# Patient Record
Sex: Male | Born: 1980 | Race: Black or African American | Hispanic: No | Marital: Single | Smoking: Former smoker
Health system: Southern US, Community
[De-identification: ages and names within clinical notes are randomized; demographics above are authoritative.]

## PROBLEM LIST (undated history)

## (undated) DIAGNOSIS — L988 Other specified disorders of the skin and subcutaneous tissue: Secondary | ICD-10-CM

## (undated) DIAGNOSIS — Z789 Other specified health status: Secondary | ICD-10-CM

## (undated) DIAGNOSIS — F1910 Other psychoactive substance abuse, uncomplicated: Secondary | ICD-10-CM

## (undated) DIAGNOSIS — F1111 Opioid abuse, in remission: Secondary | ICD-10-CM

## (undated) DIAGNOSIS — F410 Panic disorder [episodic paroxysmal anxiety] without agoraphobia: Secondary | ICD-10-CM

## (undated) DIAGNOSIS — F419 Anxiety disorder, unspecified: Secondary | ICD-10-CM

## (undated) DIAGNOSIS — Z87898 Personal history of other specified conditions: Secondary | ICD-10-CM

## (undated) DIAGNOSIS — Z973 Presence of spectacles and contact lenses: Secondary | ICD-10-CM

## (undated) DIAGNOSIS — F329 Major depressive disorder, single episode, unspecified: Secondary | ICD-10-CM

## (undated) DIAGNOSIS — R002 Palpitations: Secondary | ICD-10-CM

## (undated) DIAGNOSIS — Z8679 Personal history of other diseases of the circulatory system: Secondary | ICD-10-CM

## (undated) DIAGNOSIS — Z8659 Personal history of other mental and behavioral disorders: Secondary | ICD-10-CM

## (undated) DIAGNOSIS — N401 Enlarged prostate with lower urinary tract symptoms: Secondary | ICD-10-CM

## (undated) HISTORY — DX: Anxiety disorder, unspecified: F41.9

## (undated) HISTORY — DX: Other specified health status: Z78.9

## (undated) HISTORY — DX: Major depressive disorder, single episode, unspecified: F32.9

---
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
## 2011-05-07 HISTORY — PX: OTHER SURGICAL HISTORY: SHX169

## 2012-03-08 ENCOUNTER — Emergency Department (HOSPITAL_COMMUNITY): Payer: Self-pay

## 2012-03-08 ENCOUNTER — Emergency Department (HOSPITAL_COMMUNITY)
Admission: EM | Admit: 2012-03-08 | Discharge: 2012-03-08 | Disposition: A | Discharge status: Home / Self Care | Payer: Self-pay | Attending: Emergency Medicine | Admitting: Emergency Medicine

## 2012-03-08 ENCOUNTER — Encounter (HOSPITAL_COMMUNITY): Payer: Self-pay | Admitting: Anesthesiology

## 2012-03-08 ENCOUNTER — Encounter (HOSPITAL_COMMUNITY)
Admission: EM | Disposition: A | Discharge status: Home / Self Care | Payer: Self-pay | Source: Home / Self Care | Attending: Emergency Medicine

## 2012-03-08 ENCOUNTER — Emergency Department (HOSPITAL_COMMUNITY): Payer: Self-pay | Admitting: Anesthesiology

## 2012-03-08 DIAGNOSIS — W268XXA Contact with other sharp object(s), not elsewhere classified, initial encounter: Secondary | ICD-10-CM | POA: Insufficient documentation

## 2012-03-08 DIAGNOSIS — Y9389 Activity, other specified: Secondary | ICD-10-CM | POA: Insufficient documentation

## 2012-03-08 DIAGNOSIS — S81802A Unspecified open wound, left lower leg, initial encounter: Secondary | ICD-10-CM

## 2012-03-08 DIAGNOSIS — S81009A Unspecified open wound, unspecified knee, initial encounter: Secondary | ICD-10-CM | POA: Insufficient documentation

## 2012-03-08 HISTORY — PX: I & D EXTREMITY: SHX5045

## 2012-03-08 LAB — BASIC METABOLIC PANEL
CO2: 29 mEq/L (ref 19–32)
Calcium: 9.2 mg/dL (ref 8.4–10.5)
Chloride: 98 mEq/L (ref 96–112)
Glucose, Bld: 101 mg/dL — ABNORMAL HIGH (ref 70–99)
Sodium: 138 mEq/L (ref 135–145)

## 2012-03-08 LAB — CBC
HCT: 42.8 % (ref 39.0–52.0)
MCH: 31.9 pg (ref 26.0–34.0)
MCV: 94.7 fL (ref 78.0–100.0)
Platelets: 247 10*3/uL (ref 150–400)
RBC: 4.52 MIL/uL (ref 4.22–5.81)

## 2012-03-08 SURGERY — IRRIGATION AND DEBRIDEMENT EXTREMITY
Anesthesia: General | Site: Leg Lower | Laterality: Left | Wound class: Dirty or Infected

## 2012-03-08 MED ORDER — OXYCODONE HCL 5 MG PO TABS
ORAL_TABLET | ORAL | Status: AC
  Filled 2012-03-08: qty 1

## 2012-03-08 MED ORDER — HYDROMORPHONE HCL PF 1 MG/ML IJ SOLN
0.5000 mg | INTRAMUSCULAR | Status: AC | PRN
  Administered 2012-03-08 (×2): 0.5 mg via INTRAVENOUS

## 2012-03-08 MED ORDER — CEFAZOLIN SODIUM 1-5 GM-% IV SOLN
INTRAVENOUS | Status: AC
  Filled 2012-03-08: qty 50

## 2012-03-08 MED ORDER — CEFAZOLIN SODIUM 1-5 GM-% IV SOLN
1.0000 g | Freq: Once | INTRAVENOUS | Status: AC
  Administered 2012-03-08: 1 g via INTRAVENOUS

## 2012-03-08 MED ORDER — MIDAZOLAM HCL 5 MG/5ML IJ SOLN
INTRAMUSCULAR | Status: DC | PRN
  Administered 2012-03-08: 2 mg via INTRAVENOUS

## 2012-03-08 MED ORDER — OXYCODONE HCL 5 MG/5ML PO SOLN: 5.0000 mg | Freq: Once | ORAL | Status: AC | PRN

## 2012-03-08 MED ORDER — ONDANSETRON HCL 4 MG/2ML IJ SOLN
INTRAMUSCULAR | Status: AC
  Filled 2012-03-08: qty 2

## 2012-03-08 MED ORDER — HYDROMORPHONE HCL PF 1 MG/ML IJ SOLN
1.0000 mg | Freq: Once | INTRAMUSCULAR | Status: AC
  Administered 2012-03-08: 1 mg via INTRAVENOUS
  Filled 2012-03-08: qty 1

## 2012-03-08 MED ORDER — HYDROMORPHONE HCL PF 1 MG/ML IJ SOLN
INTRAMUSCULAR | Status: AC
  Filled 2012-03-08: qty 1

## 2012-03-08 MED ORDER — TETANUS-DIPHTH-ACELL PERTUSSIS 5-2.5-18.5 LF-MCG/0.5 IM SUSP
INTRAMUSCULAR | Status: AC
  Filled 2012-03-08: qty 0.5

## 2012-03-08 MED ORDER — SODIUM CHLORIDE 0.9 % IR SOLN
Status: DC | PRN
  Administered 2012-03-08: 07:00:00

## 2012-03-08 MED ORDER — MEPERIDINE HCL 25 MG/ML IJ SOLN: 6.2500 mg | INTRAMUSCULAR | Status: DC | PRN

## 2012-03-08 MED ORDER — HYDROCODONE-ACETAMINOPHEN 5-500 MG PO TABS: 1.0000 | ORAL_TABLET | Freq: Four times a day (QID) | ORAL | Status: DC | PRN

## 2012-03-08 MED ORDER — HYDROMORPHONE BOLUS VIA INFUSION: 1.0000 mg | Freq: Once | INTRAVENOUS | Status: DC

## 2012-03-08 MED ORDER — OXYCODONE HCL 5 MG PO TABS
5.0000 mg | ORAL_TABLET | Freq: Once | ORAL | Status: AC | PRN
  Administered 2012-03-08: 5 mg via ORAL

## 2012-03-08 MED ORDER — LACTATED RINGERS IV SOLN
INTRAVENOUS | Status: DC | PRN
  Administered 2012-03-08 (×2): via INTRAVENOUS

## 2012-03-08 MED ORDER — ONDANSETRON HCL 4 MG/2ML IJ SOLN
INTRAMUSCULAR | Status: DC | PRN
  Administered 2012-03-08: 4 mg via INTRAVENOUS

## 2012-03-08 MED ORDER — ONDANSETRON HCL 4 MG/2ML IJ SOLN: 4.0000 mg | Freq: Once | INTRAMUSCULAR | Status: DC | PRN

## 2012-03-08 MED ORDER — HYDROMORPHONE HCL PF 1 MG/ML IJ SOLN
0.2500 mg | INTRAMUSCULAR | Status: DC | PRN
  Administered 2012-03-08 (×4): 0.5 mg via INTRAVENOUS

## 2012-03-08 MED ORDER — SODIUM CHLORIDE 0.9 % IR SOLN
Status: DC | PRN
  Administered 2012-03-08: 3000 mL

## 2012-03-08 MED ORDER — SUCCINYLCHOLINE CHLORIDE 20 MG/ML IJ SOLN
INTRAMUSCULAR | Status: DC | PRN
  Administered 2012-03-08: 100 mg via INTRAVENOUS

## 2012-03-08 MED ORDER — PROPOFOL 10 MG/ML IV BOLUS
INTRAVENOUS | Status: DC | PRN
  Administered 2012-03-08: 110 mg via INTRAVENOUS

## 2012-03-08 MED ORDER — LIDOCAINE HCL (CARDIAC) 20 MG/ML IV SOLN
INTRAVENOUS | Status: DC | PRN
  Administered 2012-03-08: 100 mg via INTRAVENOUS

## 2012-03-08 MED ORDER — HYDROMORPHONE HCL PF 1 MG/ML IJ SOLN
INTRAMUSCULAR | Status: AC
  Administered 2012-03-08: 1 mg via INTRAVENOUS
  Filled 2012-03-08: qty 1

## 2012-03-08 MED ORDER — FENTANYL CITRATE 0.05 MG/ML IJ SOLN
INTRAMUSCULAR | Status: DC | PRN
  Administered 2012-03-08 (×2): 100 ug via INTRAVENOUS

## 2012-03-08 SURGICAL SUPPLY — 52 items
BANDAGE ELASTIC 4 VELCRO ST LF (GAUZE/BANDAGES/DRESSINGS) ×2 IMPLANT
BANDAGE ELASTIC 6 VELCRO ST LF (GAUZE/BANDAGES/DRESSINGS) ×2 IMPLANT
BANDAGE GAUZE ELAST BULKY 4 IN (GAUZE/BANDAGES/DRESSINGS) ×2 IMPLANT
BLADE SURG 10 STRL SS (BLADE) IMPLANT
BNDG COHESIVE 4X5 TAN STRL (GAUZE/BANDAGES/DRESSINGS) IMPLANT
CLOTH BEACON ORANGE TIMEOUT ST (SAFETY) ×2 IMPLANT
COVER SURGICAL LIGHT HANDLE (MISCELLANEOUS) ×2 IMPLANT
CUFF TOURNIQUET SINGLE 34IN LL (TOURNIQUET CUFF) IMPLANT
CUFF TOURNIQUET SINGLE 44IN (TOURNIQUET CUFF) IMPLANT
DRAIN PENROSE 1/4X12 LTX STRL (WOUND CARE) ×2 IMPLANT
DRSG ADAPTIC 3X8 NADH LF (GAUZE/BANDAGES/DRESSINGS) ×2 IMPLANT
DRSG PAD ABDOMINAL 8X10 ST (GAUZE/BANDAGES/DRESSINGS) ×2 IMPLANT
DURAPREP 26ML APPLICATOR (WOUND CARE) IMPLANT
ELECT REM PT RETURN 9FT ADLT (ELECTROSURGICAL) ×2
ELECTRODE REM PT RTRN 9FT ADLT (ELECTROSURGICAL) ×1 IMPLANT
EVACUATOR 1/8 PVC DRAIN (DRAIN) IMPLANT
FACESHIELD LNG OPTICON STERILE (SAFETY) ×2 IMPLANT
GAUZE XEROFORM 1X8 LF (GAUZE/BANDAGES/DRESSINGS) ×2 IMPLANT
GLOVE BIO SURGEON STRL SZ8.5 (GLOVE) ×2 IMPLANT
GLOVE BIOGEL PI IND STRL 8.5 (GLOVE) ×1 IMPLANT
GLOVE BIOGEL PI INDICATOR 8.5 (GLOVE) ×1
GLOVE ECLIPSE 7.0 STRL STRAW (GLOVE) ×2 IMPLANT
GLOVE SS BIOGEL STRL SZ 8 (GLOVE) ×1 IMPLANT
GLOVE SUPERSENSE BIOGEL SZ 8 (GLOVE) ×1
GOWN EXTRA PROTECTION XL (GOWNS) ×2 IMPLANT
GOWN PREVENTION PLUS XXLARGE (GOWN DISPOSABLE) ×2 IMPLANT
GOWN STRL NON-REIN LRG LVL3 (GOWN DISPOSABLE) IMPLANT
HANDPIECE INTERPULSE COAX TIP (DISPOSABLE)
IV NS IRRIG 3000ML ARTHROMATIC (IV SOLUTION) ×2 IMPLANT
KIT BASIN OR (CUSTOM PROCEDURE TRAY) ×2 IMPLANT
KIT ROOM TURNOVER OR (KITS) ×2 IMPLANT
MANIFOLD NEPTUNE II (INSTRUMENTS) ×2 IMPLANT
NS IRRIG 1000ML POUR BTL (IV SOLUTION) ×2 IMPLANT
PACK ORTHO EXTREMITY (CUSTOM PROCEDURE TRAY) ×2 IMPLANT
PAD ARMBOARD 7.5X6 YLW CONV (MISCELLANEOUS) ×4 IMPLANT
PENCIL BUTTON HOLSTER BLD 10FT (ELECTRODE) IMPLANT
SET HNDPC FAN SPRY TIP SCT (DISPOSABLE) IMPLANT
SPONGE GAUZE 4X4 12PLY (GAUZE/BANDAGES/DRESSINGS) ×2 IMPLANT
SPONGE LAP 18X18 X RAY DECT (DISPOSABLE) ×2 IMPLANT
STOCKINETTE IMPERVIOUS 9X36 MD (GAUZE/BANDAGES/DRESSINGS) IMPLANT
SUT ETHILON 3 0 PS 1 (SUTURE) IMPLANT
SUT PROLENE 2 0 CT 1 (SUTURE) ×4 IMPLANT
SUT VIC AB 2-0 CT1 27 (SUTURE) ×3
SUT VIC AB 2-0 CT1 TAPERPNT 27 (SUTURE) ×3 IMPLANT
TOWEL OR 17X24 6PK STRL BLUE (TOWEL DISPOSABLE) ×2 IMPLANT
TOWEL OR 17X26 10 PK STRL BLUE (TOWEL DISPOSABLE) ×2 IMPLANT
TUBE ANAEROBIC SPECIMEN COL (MISCELLANEOUS) IMPLANT
TUBE CONNECTING 12X1/4 (SUCTIONS) ×2 IMPLANT
TUBING CYSTO DISP (UROLOGICAL SUPPLIES) ×2 IMPLANT
UNDERPAD 30X30 INCONTINENT (UNDERPADS AND DIAPERS) ×2 IMPLANT
WATER STERILE IRR 1000ML POUR (IV SOLUTION) IMPLANT
YANKAUER SUCT BULB TIP NO VENT (SUCTIONS) ×2 IMPLANT
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
## 2012-03-08 NOTE — Anesthesia Procedure Notes (Signed)
Procedure Name: Intubation Date/Time: 03/08/2012 6:08 AM Performed by: Garen Lah Pre-anesthesia Checklist: Patient identified, Timeout performed, Emergency Drugs available, Suction available and Patient being monitored Patient Re-evaluated:Patient Re-evaluated prior to inductionOxygen Delivery Method: Circle system utilized Preoxygenation: Pre-oxygenation with 100% oxygen Intubation Type: IV induction, Cricoid Pressure applied and Rapid sequence Laryngoscope Size: Mac and 3 Grade View: Grade I Tube type: Oral Tube size: 7.5 mm Number of attempts: 1 Airway Equipment and Method: Stylet Placement Confirmation: ETT inserted through vocal cords under direct vision,  positive ETCO2 and breath sounds checked- equal and bilateral Secured at: 21 cm Tube secured with: Tape Dental Injury: Teeth and Oropharynx as per pre-operative assessment

## 2012-03-08 NOTE — Progress Notes (Signed)
Orthopedic Tech Progress Note Patient Details:  Jesse Long [birth year] [id number]  Ortho Devices Type of Ortho Device: Crutches Ortho Device/Splint Interventions: Application   Cammer, Mickie Bail 03/08/2012, 8:40 AM

## 2012-03-08 NOTE — ED Notes (Signed)
Pt resting quietly at the time. Friends at bedside. States some relief of pain, rating 6/10 at the time. No signs of distress noted.

## 2012-03-08 NOTE — Op Note (Signed)
#  [id number] 
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
## 2012-03-08 NOTE — ED Notes (Signed)
Orthopedic physician at the bedside. Pt to be taken to OR for surgery.

## 2012-03-08 NOTE — Transfer of Care (Signed)
Immediate Anesthesia Transfer of Care Note  Patient: Jesse Long  Procedure(s) Performed: Procedure(s) (LRB) with comments: IRRIGATION AND DEBRIDEMENT EXTREMITY (Left)  Patient Location: PACU  Anesthesia Type:General  Level of Consciousness: sedated  Airway & Oxygen Therapy: Patient Spontanous Breathing  Post-op Assessment: Report given to PACU RN and Post -op Vital signs reviewed and stable  Post vital signs: Reviewed and stable  Complications: No apparent anesthesia complications

## 2012-03-08 NOTE — Preoperative (Signed)
Beta Blockers   Reason not to administer Beta Blockers:Not Applicable. No home beta blockers 

## 2012-03-08 NOTE — Interval H&P Note (Signed)
History and Physical Interval Note:  03/08/2012 5:58 AM  Jesse Long  has presented today for surgery, with the diagnosis of Open wound left leg  The various methods of treatment have been discussed with the patient and family. After consideration of risks, benefits and other options for treatment, the patient has consented to  Procedure(s) (LRB) with comments: IRRIGATION AND DEBRIDEMENT EXTREMITY (Left) as a surgical intervention .  The patient's history has been reviewed, patient examined, no change in status, stable for surgery.  I have reviewed the patient's chart and labs.  Questions were answered to the patient's satisfaction.     Kerolos Nehme G
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
## 2012-03-08 NOTE — Anesthesia Preprocedure Evaluation (Addendum)
Anesthesia Evaluation  Patient identified by MRN, date of birth, ID band Patient awake    Reviewed: Allergy & Precautions, H&P , NPO status , Patient's Chart, lab work & pertinent test results, reviewed documented beta blocker date and time   History of Anesthesia Complications Negative for: history of anesthetic complications  Airway Mallampati: I TM Distance: >3 FB Neck ROM: Full    Dental  (+) Teeth Intact and Dental Advisory Given   Pulmonary          Cardiovascular     Neuro/Psych    GI/Hepatic   Endo/Other    Renal/GU      Musculoskeletal   Abdominal   Peds  Hematology   Anesthesia Other Findings   Reproductive/Obstetrics                          Anesthesia Physical Anesthesia Plan  ASA: I and emergent  Anesthesia Plan: General   Post-op Pain Management:    Induction: Intravenous, Rapid sequence and Cricoid pressure planned  Airway Management Planned: Oral ETT  Additional Equipment:   Intra-op Plan:   Post-operative Plan: Extubation in OR  Informed Consent: I have reviewed the patients History and Physical, chart, labs and discussed the procedure including the risks, benefits and alternatives for the proposed anesthesia with the patient or authorized representative who has indicated his/her understanding and acceptance.   Dental advisory given  Plan Discussed with: CRNA and Surgeon  Anesthesia Plan Comments:        Anesthesia Quick Evaluation

## 2012-03-08 NOTE — ED Notes (Signed)
Pt transported to OR for surgery, consent signed. Pt had no further questions.

## 2012-03-08 NOTE — Anesthesia Postprocedure Evaluation (Signed)
  Anesthesia Post-op Note  Patient: Jesse Long  Procedure(s) Performed: Procedure(s) (LRB) with comments: IRRIGATION AND DEBRIDEMENT EXTREMITY (Left)  Patient Location: PACU  Anesthesia Type:General  Level of Consciousness: awake and alert   Airway and Oxygen Therapy: Patient Spontanous Breathing  Post-op Pain: moderate  Post-op Assessment: Post-op Vital signs reviewed, Patient's Cardiovascular Status Stable, Respiratory Function Stable, Patent Airway and No signs of Nausea or vomiting  Post-op Vital Signs: Reviewed and stable  Complications: No apparent anesthesia complications
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
## 2012-03-09 ENCOUNTER — Encounter (HOSPITAL_COMMUNITY): Payer: Self-pay | Admitting: Orthopaedic Surgery

## 2013-05-06 DIAGNOSIS — F1910 Other psychoactive substance abuse, uncomplicated: Secondary | ICD-10-CM

## 2013-05-06 HISTORY — DX: Other psychoactive substance abuse, uncomplicated: F19.10

## 2013-07-24 ENCOUNTER — Encounter (HOSPITAL_COMMUNITY): Payer: Self-pay | Admitting: Emergency Medicine

## 2013-07-24 ENCOUNTER — Emergency Department (HOSPITAL_COMMUNITY)
Admission: EM | Admit: 2013-07-24 | Discharge: 2013-07-24 | Disposition: A | Discharge status: Home / Self Care | Payer: Self-pay | Attending: Emergency Medicine | Admitting: Emergency Medicine

## 2013-07-24 DIAGNOSIS — F17200 Nicotine dependence, unspecified, uncomplicated: Secondary | ICD-10-CM | POA: Insufficient documentation

## 2013-07-24 DIAGNOSIS — F1410 Cocaine abuse, uncomplicated: Secondary | ICD-10-CM | POA: Insufficient documentation

## 2013-07-24 DIAGNOSIS — R002 Palpitations: Secondary | ICD-10-CM | POA: Insufficient documentation

## 2013-07-24 DIAGNOSIS — Z7982 Long term (current) use of aspirin: Secondary | ICD-10-CM | POA: Insufficient documentation

## 2013-07-24 HISTORY — DX: Other psychoactive substance abuse, uncomplicated: F19.10

## 2013-07-24 LAB — I-STAT CHEM 8, ED
BUN: 10 mg/dL (ref 6–23)
Calcium, Ion: 1.17 mmol/L (ref 1.12–1.23)
Chloride: 98 mEq/L (ref 96–112)
Creatinine, Ser: 1 mg/dL (ref 0.50–1.35)
Glucose, Bld: 85 mg/dL (ref 70–99)
HEMATOCRIT: 43 % (ref 39.0–52.0)
HEMOGLOBIN: 14.6 g/dL (ref 13.0–17.0)
POTASSIUM: 3.6 meq/L — AB (ref 3.7–5.3)
SODIUM: 137 meq/L (ref 137–147)
TCO2: 27 mmol/L (ref 0–100)

## 2013-07-24 LAB — RAPID URINE DRUG SCREEN, HOSP PERFORMED
AMPHETAMINES: NOT DETECTED
BENZODIAZEPINES: NOT DETECTED
Barbiturates: NOT DETECTED
Cocaine: POSITIVE — AB
Opiates: NOT DETECTED
TETRAHYDROCANNABINOL: POSITIVE — AB

## 2013-07-24 NOTE — Discharge Instructions (Signed)
°Emergency Department Resource Guide °1) Find a Doctor and Pay Out of Pocket °Although you won't have to find out who is covered by your insurance plan, it is a good idea to ask around and get recommendations. You will then need to call the office and see if the doctor you have chosen will accept you as a new patient and what types of options they offer for patients who are self-pay. Some doctors offer discounts or will set up payment plans for their patients who do not have insurance, but you will need to ask so you aren't surprised when you get to your appointment. ° °2) Contact Your Local Health Department °Not all health departments have doctors that can see patients for sick visits, but many do, so it is worth a call to see if yours does. If you don't know where your local health department is, you can check in your phone book. The CDC also has a tool to help you locate your state's health department, and many state websites also have listings of all of their local health departments. ° °3) Find a Walk-in Clinic °If your illness is not likely to be very severe or complicated, you may want to try a walk in clinic. These are popping up all over the country in pharmacies, drugstores, and shopping centers. They're usually staffed by nurse practitioners or physician assistants that have been trained to treat common illnesses and complaints. They're usually fairly quick and inexpensive. However, if you have serious medical issues or chronic medical problems, these are probably not your best option. ° °No Primary Care Doctor: °- Call [facility] at  832-8000 - they can help you locate a primary care doctor that  accepts your insurance, provides certain services, etc. °- Physician Referral Service- 1-800-533-3463 ° °Chronic Pain Problems: °Organization         Address  Phone   Notes  °[facility]  (336) 297-2271 Patients need to be referred by their primary care doctor.  ° °Medication  Assistance: °Organization         Address  Phone   Notes  °[place] [facility] [address] °[place], [place] [address] (336) 641-8030 --Must be a resident of [place] °-- Must have NO insurance coverage whatsoever (no Medicaid/ Medicare, etc.) °-- The pt. MUST have a primary care doctor that directs their care regularly and follows them in the community °  °[facility]  (866) 331-1348   °[employer]  (888) 892-1162   ° °Agencies that provide inexpensive medical care: °Organization         Address  Phone   Notes  °[facility]  (336) 832-8035   °[facility]    (336) 832-7272   °[facility] [address] °[place], [place] [address] (336) 832-4777   °[facility] [address], °[place] (336) 271-4999   °[facility]    (336) 373-0678   °[facility]    (336) 272-1050   °[facility] ° [address], [place] Phone:  (336) 832-4444, Fax:  (336) 832-4440 Hours of Operation:  9 am - 6 pm, M-F.  Also accepts Medicaid/Medicare and self-pay.  °[facility] ° [address], [place] Phone: (336) 832-3150, Fax: (336) 832-3151. Hours of Operation:  8:30 am - 5:30 pm, M-F.  Also accepts Medicaid and self-pay.  °[facility] [place] [address], [place] Phone: (336) 878-6027   °[facility] [address], [place], [place] (336)723-1848, Ext. 123 Mondays & Thursdays: 7-9 AM.  First 15 patients are seen on a first come, first serve basis. °  ° °Medicaid-accepting [place] Providers: ° °Organization         Address  Phone   Notes  °[facility] [address], [place] (336) 641-2100 Also accepts self-pay patients.  °[facility] [address], [place] ° (336) 856-9996   °[facility] [address], [place]  (336) 288-8857   °[facility] [address] [place] [address], [place] (336) 299-7000   °Veita Bland [address], [place]  ° (336) 373-1557 Only accepts [place] Access Medicaid patients after they have their name applied to their card.  ° °Self-Pay (no insurance) in [place]: ° °Organization         Address  Phone   Notes  °Sickle Cell Patients, [facility] [address], [place] (336) 832-1970   °[facility] [address], [place] (336) 832-4400   °[facility] ° [address], [place] (336) 992-4800   °[facility]/Dr. Osei-Bonsu ° [address] [place] [address], [place] or [address], [place] (336) 841-8500 Phone number for both [place] and [place] locations is the same.  °[facility] [address], [place] (336) 299-0000   °[facility] [address] [place] [address], [place] or [address] (336) 852-7530 °(336) 878-2260   °[facility] [address], [place] (336) 350-1642, phone; (336) 294-5005, fax Sees patients 1st and 3rd Saturday of every month.  Must not qualify for public or private insurance (i.e. Medicaid, Medicare, [place] Health Choice, Veterans' Benefits) • Household income should be no more than 200% of the poverty level •The clinic cannot treat you if you are pregnant or think you are pregnant • Sexually transmitted diseases are not treated at the clinic.  ° ° °Dental Care: °Organization         Address  Phone  Notes  °[place] [facility] [address], [place] (336) 641-6152 Accepts children up to age 21 who are enrolled in Medicaid or [place] Health Choice; pregnant women with a Medicaid card; and children who have applied for Medicaid or [place] Health Choice, but were declined, whose parents can pay a reduced fee at time of service.  °[place]  [facility] [place]  [address], [place] (336) 641-7733 Accepts children up to age 21 who are enrolled in Medicaid or [place] Health Choice; pregnant women with a Medicaid card; and children who have applied for Medicaid or [place] Health Choice, but were declined, whose parents can pay a reduced fee at time of service.  °[facility] ° [address], [place] (336) 641-4533 Patients are seen by appointment only. Walk-ins are not accepted. [facility] will see patients 18 years of age and older. °Monday - Tuesday (8am-5pm) °Most Wednesdays (8:30-5pm) °$30 per visit, cash only  °[facility] ° [address], [place] (336) 641-4533 Patients are seen by appointment only. Walk-ins are not accepted. [facility] will see patients 18 years of age and older. °One   Wednesday Evening (Monthly: Volunteer Based).  $30 per visit, cash only  °[facility]  (919) 537-3737 for adults; Children under age 4, call Graduate Pediatric Dentistry at (919) [phone]. Children aged 4-14, please call (919) [phone] to request a pediatric application. ° Dental services are provided in all areas of dental care including fillings, crowns and bridges, complete and partial dentures, implants, gum treatment, root canals, and extractions. Preventive care is also provided. Treatment is provided to both adults and children. °Patients are selected via a lottery and there is often a waiting list. °  °[facility] [address], °[place] ° (336) 763-8833 [email or web address] °  °[facility] [address], [place], [place] (336)723-1848, Ext. 123 Second and Fourth Thursday of each month, opens at 6:30 AM; Clinic ends at 9 AM.  Patients are seen on a first-come first-served basis, and a limited number are seen during each clinic.  ° °[facility] ° [address], [place], [place] (336) 723-7904    Eligibility Requirements °You must have lived in [place], [place], or [place] counties for at least the last three months. °  You cannot be eligible for state or federal sponsored healthcare insurance, including Veterans Administration, Medicaid, or Medicare. °  You generally cannot be eligible for healthcare insurance through your employer.  °  How to apply: °Eligibility screenings are held every Tuesday and Wednesday afternoon from 1:00 pm until 4:00 pm. You do not need an appointment for the interview!  °[facility] [address], [place], [place] 336-631-2330   °[facility]  336-342-8273   °[facility]  336-703-3100   °[facility]  336-570-6415   ° °Behavioral Health Resources in the Community: °Intensive Outpatient Programs °Organization         Address  Phone  Notes  °[facility] [address], [place], [place] 336-878-6098   °[facility] [address], [place], [place] 336-832-9800   °[facility] [address], [place], [place] ° 336-882-2125   °[facility] [address],  °[place], [place] 1-800-853-5163 or 336-641-4981   °Substance Abuse Resources °Organization         Address  Phone  Notes  °[facility]  336-882-2125   °[facility]  336-784-9470   °[facility]  336-285-9073   °[facility]  336-845-3988   °Residential & Outpatient Substance Abuse Program  1-800-659-3381   °Psychological Services °Organization         Address  Phone  Notes  °[facility]  336- 832-9600   °[facility]  336- 378-7881   °[facility] [address], [place] 1-800-853-5163 or 336-641-4981   ° °Mobile Crisis Teams °Organization         Address  Phone  Notes  °[facility], Mobile Crisis Care Unit  1-877-626-1772   °Assertive °[facility] ° [address]  [place], [place] 336-834-9664   °Sharon DeEsch [address] °[place] 336-554-5454   ° °Self-Help/Support Groups °Organization         Address  Phone             Notes  °[facility]  - variety of support groups  336- 373-1402 Call for more information  °Narcotics Anonymous (NA), [facility] [address], °[place]  2 meetings at this location  ° °  Residential Treatment Programs Organization         Address  Phone  Notes  [facility] [address],    [place]  [phone]   [facility]  [address], [place] [phone], [place], [place] [phone]([phone]) [phone]   [facility] [address], [place] [phone] Admissions: 8am-3pm M-F  [facility] [address]    [place], [place] [phone]   [facility] [address], [place], [place] [phone]   [facility] [address]  [place], [place] [phone]   [facility] Intensive Outpatient [address], [place], [place] [phone]   [facility] [address]  [place], [place] [phone] or [phone]   [facility] ([facility] [address] [place], [place] [phone]([phone]) [phone] Accepts Medicaid  [facility] [address]  [place] [phone] Substance Abuse/Addiction Treatment   [facility] Organization         Address  Phone  Notes  [facility]  (731)313-6250(888) [phone]   Angie FavaJulie Brannon, PhD [address], [address] [place], [place]   [phone](336) [phone]) [phone] or ([phone](336) [phone]   [facility]   [address] [place], [place] [phone](336) [phone]   [facility] [address], [place], [place] [phone](336) [phone] Insurance/Medicaid/sponsorship through [facility]  [facility] [address]                                    [place], [place] [phone](336) [phone] Therapy/tele-psych/case    [facility] [address]   [place], [place] [phone](336) [phone]    Dr. Lolly MustacheArfeen  [phone]) [phone]   [facility]  [employer] [facility] 1) [address], [place] 2) [address], [place] 3)  [address], [place] [phone](336) ([phone](336) ([phone]  ([phone](336) [phone]   [facility] Child Abuse Hotline (507) 210-7737(336) 430 837 0126 or [phone](336) [phone])[phone] (After Hours)       Avoid avoid caffinated products, such as teas, colas, coffee, chocolate. Avoid over the counter cold medicines, herbal or "natural vitamin" products, and illicit drugs (such as cocaine) because they can contain stimulants.  Call your regular medical doctor Monday to schedule a follow up appointment this week.  Return to the Emergency Department immediately if worsening.

## 2013-07-24 NOTE — ED Provider Notes (Signed)
CSN: [id number]     Arrival date & time 07/24/13  1136 History   First MD Initiated Contact with Patient 07/24/13 1222     Chief Complaint  Patient presents with  . Tachycardia  . Addiction Problem    HPI Pt was seen at 1240. Per pt, c/o sudden onset and resolution of one episode of palpitations that began this morning shortly after used cocaine for the first time approximately 0800. Pt states he "felt like my heart was beating out of my chest." EMS gave IV versed with improvement in his symptoms. Denies CP/SOB, no cough, no abd pain, no N/V/D, no back pain.     Past Medical History  Diagnosis Date  . No pertinent past medical history   . Polysubstance abuse    Past Surgical History  Procedure Laterality Date  . I&d extremity  03/08/2012    Procedure: IRRIGATION AND DEBRIDEMENT EXTREMITY;  Surgeon: Velna Ochs, MD;  Location: [facility] OR;  Service: Orthopedics;  Laterality: Left;      Social History Main Topics  . Smoking status: Current Every Day Smoker -- 0.25 packs/day    Types: Cigarettes  . Smokeless tobacco: None  . Alcohol Use: No  . Drug Use: Yes    Special: Cocaine, Marijuana     Comment: heroin    Review of Systems ROS: Statement: All systems negative except as marked or noted in the HPI; Constitutional: Negative for fever and chills. ; ; Eyes: Negative for eye pain, redness and discharge. ; ; ENMT: Negative for ear pain, hoarseness, nasal congestion, sinus pressure and sore throat. ; ; Cardiovascular: +palpitations. Negative for chest pain, diaphoresis, dyspnea and peripheral edema. ; ; Respiratory: Negative for cough, wheezing and stridor. ; ; Gastrointestinal: Negative for nausea, vomiting, diarrhea, abdominal pain, blood in stool, hematemesis, jaundice and rectal bleeding. . ; ; Genitourinary: Negative for dysuria, flank pain and hematuria. ; ; Musculoskeletal: Negative for back pain and neck pain. Negative for swelling and trauma.; ; Skin: Negative for pruritus, rash,  abrasions, blisters, bruising and skin lesion.; ; Neuro: Negative for headache, lightheadedness and neck stiffness. Negative for weakness, altered level of consciousness , altered mental status, extremity weakness, paresthesias, involuntary movement, seizure and syncope.     Allergies  Review of patient's allergies indicates no known allergies.  Home Medications   Current Outpatient Rx  Name  Route  Sig  Dispense  Refill  . acetaminophen (TYLENOL) 325 MG tablet   Oral   Take 650 mg by mouth every 6 (six) hours as needed for mild pain, fever or headache.         Marland Kitchen aspirin 325 MG tablet   Oral   Take 975 mg by mouth daily.          BP 111/54  Pulse 66  Temp(Src) 99.3 F (37.4 C) (Oral)  Resp 13  Ht 6\' 2"  (1.88 m)  Wt 165 lb (74.844 kg)  BMI 21.18 kg/m2  SpO2 95% Filed Vitals:   07/24/13 1147 07/24/13 1230 07/24/13 1245 07/24/13 1345  BP: 134/86 115/61 123/75 111/54  Pulse: 91 62 88 66  Temp: 99.3 F (37.4 C)     TempSrc: Oral     Resp: 21 15 20 13   Height: 6\' 2"  (1.88 m)     Weight: 165 lb (74.844 kg)     SpO2: 100% 98% 98% 95%    Physical Exam 1245: Physical examination:  Nursing notes reviewed; Vital signs and O2 SAT reviewed;  Constitutional: Well developed,  Well nourished, Well hydrated, In no acute distress; Head:  Normocephalic, atraumatic; Eyes: EOMI, PERRL, No scleral icterus; ENMT: Mouth and pharynx normal, Mucous membranes moist; Neck: Supple, Full range of motion, No lymphadenopathy; Cardiovascular: Regular rate and rhythm, No murmur, rub, or gallop; Respiratory: Breath sounds clear & equal bilaterally, No rales, rhonchi, wheezes.  Speaking full sentences with ease, Normal respiratory effort/excursion; Chest: Nontender, Movement normal; Abdomen: Soft, Nontender, Nondistended, Normal bowel sounds; Genitourinary: No CVA tenderness; Extremities: Pulses normal, No tenderness, No edema, No calf edema or asymmetry.; Neuro: AA&Ox3, Major CN grossly intact.  Speech  clear. No gross focal motor or sensory deficits in extremities.; Skin: Color normal, Warm, Dry.; Psych:  Affect full, calm/cooperative.   ED Course  Procedures      EKG Interpretation   Date/Time:  Saturday July 24 2013 11:43:08 EDT Ventricular Rate:  90 PR Interval:  172 QRS Duration: 103 QT Interval:  354 QTC Calculation: 433 R Axis:   84 Text Interpretation:  Sinus rhythm Left atrial enlargement RSR' in V1 or  V2, right VCD or RVH ST elevation, consider early repolarization No old  tracing to compare Confirmed by [facility]  MD, Nicholos JohnsKATHLEEN [id number]) on  07/24/2013 1:58:32 PM      MDM  MDM Reviewed: previous chart, nursing note and vitals Reviewed previous: labs Interpretation: labs and ECG    Results for orders placed during the hospital encounter of 07/24/13  URINE RAPID DRUG SCREEN (HOSP PERFORMED)      Result Value Ref Range   Opiates NONE DETECTED  NONE DETECTED   Cocaine POSITIVE (*) NONE DETECTED   Benzodiazepines NONE DETECTED  NONE DETECTED   Amphetamines NONE DETECTED  NONE DETECTED   Tetrahydrocannabinol POSITIVE (*) NONE DETECTED   Barbiturates NONE DETECTED  NONE DETECTED  I-STAT CHEM 8, ED      Result Value Ref Range   Sodium 137  137 - 147 mEq/L   Potassium 3.6 (*) 3.7 - 5.3 mEq/L   Chloride 98  96 - 112 mEq/L   BUN 10  6 - 23 mg/dL   Creatinine, Ser 6.041.00  0.50 - 1.35 mg/dL   Glucose, Bld 85  70 - 99 mg/dL   Calcium, Ion 5.401.17  9.811.12 - 1.23 mmol/L   TCO2 27  0 - 100 mmol/L   Hemoglobin 14.6  13.0 - 17.0 g/dL   HCT 19.143.0  47.839.0 - 29.552.0 %    1445:  Pt's monitor remains NSR, rate slowly decreasing from 90's to 60's. BP stable. Pt states he feels better and wants to go home now.  Pt cautioned regarding polysubstance use and side effects; verb understanding. Dx and testing d/w pt.  Questions answered.  Verb understanding, agreeable to d/c home with outpt f/u.      Laray AngerKathleen M Jayce Kainz, DO 07/26/13 2117

## 2013-07-24 NOTE — ED Notes (Signed)
Per ems, pt coming from home. Pt took cocaine approx around 0800. First time cocaine use, recovering heroin addict. Initially tachy at 145. Pt felt like his heart was beating out of his chest. Pt give 2.5 versed. LUngs equal clear bilaterally by ems. Pt AAOX4 in NAD. States he has to focus on breathing. Pt used heroin Thursday morning. Pt cooperative.

## 2014-05-06 DIAGNOSIS — F410 Panic disorder [episodic paroxysmal anxiety] without agoraphobia: Secondary | ICD-10-CM

## 2014-05-06 HISTORY — DX: Panic disorder (episodic paroxysmal anxiety): F41.0

## 2014-06-08 ENCOUNTER — Emergency Department (HOSPITAL_COMMUNITY)
Admission: EM | Admit: 2014-06-08 | Discharge: 2014-06-09 | Disposition: A | Discharge status: Home / Self Care | Payer: 59 | Attending: Emergency Medicine | Admitting: Emergency Medicine

## 2014-06-08 ENCOUNTER — Encounter (HOSPITAL_COMMUNITY): Payer: Self-pay

## 2014-06-08 DIAGNOSIS — R0602 Shortness of breath: Secondary | ICD-10-CM | POA: Insufficient documentation

## 2014-06-08 DIAGNOSIS — F410 Panic disorder [episodic paroxysmal anxiety] without agoraphobia: Secondary | ICD-10-CM | POA: Insufficient documentation

## 2014-06-08 DIAGNOSIS — R109 Unspecified abdominal pain: Secondary | ICD-10-CM | POA: Diagnosis not present

## 2014-06-08 DIAGNOSIS — Z720 Tobacco use: Secondary | ICD-10-CM | POA: Insufficient documentation

## 2014-06-08 DIAGNOSIS — R000 Tachycardia, unspecified: Secondary | ICD-10-CM | POA: Diagnosis not present

## 2014-06-08 DIAGNOSIS — R110 Nausea: Secondary | ICD-10-CM | POA: Insufficient documentation

## 2014-06-08 DIAGNOSIS — F419 Anxiety disorder, unspecified: Secondary | ICD-10-CM | POA: Insufficient documentation

## 2014-06-08 LAB — CBC WITH DIFFERENTIAL/PLATELET
BASOS PCT: 1 % (ref 0–1)
Basophils Absolute: 0 10*3/uL (ref 0.0–0.1)
EOS ABS: 0.1 10*3/uL (ref 0.0–0.7)
EOS PCT: 1 % (ref 0–5)
HEMATOCRIT: 38.9 % — AB (ref 39.0–52.0)
Hemoglobin: 13.6 g/dL (ref 13.0–17.0)
LYMPHS ABS: 1 10*3/uL (ref 0.7–4.0)
LYMPHS PCT: 17 % (ref 12–46)
MCH: 30.9 pg (ref 26.0–34.0)
MCHC: 35 g/dL (ref 30.0–36.0)
MCV: 88.4 fL (ref 78.0–100.0)
MONO ABS: 0.5 10*3/uL (ref 0.1–1.0)
Monocytes Relative: 8 % (ref 3–12)
NEUTROS PCT: 73 % (ref 43–77)
Neutro Abs: 4.1 10*3/uL (ref 1.7–7.7)
PLATELETS: 236 10*3/uL (ref 150–400)
RBC: 4.4 MIL/uL (ref 4.22–5.81)
RDW: 11.8 % (ref 11.5–15.5)
WBC: 5.6 10*3/uL (ref 4.0–10.5)

## 2014-06-08 LAB — TYPE AND SCREEN
ABO/RH(D): B POS
ANTIBODY SCREEN: NEGATIVE

## 2014-06-08 LAB — COMPREHENSIVE METABOLIC PANEL
ALBUMIN: 4.9 g/dL (ref 3.5–5.2)
ALK PHOS: 48 U/L (ref 39–117)
ALT: 14 U/L (ref 0–53)
AST: 23 U/L (ref 0–37)
Anion gap: 12 (ref 5–15)
BUN: 14 mg/dL (ref 6–23)
CALCIUM: 9.7 mg/dL (ref 8.4–10.5)
CO2: 25 mmol/L (ref 19–32)
Chloride: 100 mmol/L (ref 96–112)
Creatinine, Ser: 1.28 mg/dL (ref 0.50–1.35)
GFR calc Af Amer: 85 mL/min — ABNORMAL LOW (ref >90)
GFR, EST NON AFRICAN AMERICAN: 73 mL/min — AB (ref >90)
GLUCOSE: 214 mg/dL — AB (ref 70–99)
Potassium: 3.6 mmol/L (ref 3.5–5.1)
Sodium: 137 mmol/L (ref 135–145)
TOTAL PROTEIN: 7.7 g/dL (ref 6.0–8.3)
Total Bilirubin: 0.8 mg/dL (ref 0.3–1.2)

## 2014-06-08 LAB — APTT: aPTT: 28 seconds (ref 24–37)

## 2014-06-08 LAB — TROPONIN I

## 2014-06-08 LAB — ABO/RH: ABO/RH(D): B POS

## 2014-06-08 LAB — LIPASE, BLOOD: Lipase: 27 U/L (ref 11–59)

## 2014-06-08 LAB — PROTIME-INR
INR: 1.09 (ref 0.00–1.49)
PROTHROMBIN TIME: 14.2 s (ref 11.6–15.2)

## 2014-06-08 LAB — ETHANOL: Alcohol, Ethyl (B): <5 mg/dL (ref 0–9)

## 2014-06-08 MED ORDER — LORAZEPAM 2 MG/ML IJ SOLN
0.5000 mg | Freq: Once | INTRAMUSCULAR | Status: AC
  Administered 2014-06-08: 0.5 mg via INTRAVENOUS
  Filled 2014-06-08: qty 1

## 2014-06-08 MED ORDER — SODIUM CHLORIDE 0.9 % IV BOLUS (SEPSIS)
1000.0000 mL | Freq: Once | INTRAVENOUS | Status: AC
  Administered 2014-06-08: 1000 mL via INTRAVENOUS

## 2014-06-08 NOTE — ED Notes (Addendum)
Per Patient, Patient was cooking and started to feel warm all over and started to feel his heart race. Patient stated he smoked marijuana today and he hasn't smoked in a long time. Per EMS, Patient reports taking cocaine one week ago. Patient has a history of a mass in his abdomen. Patient reports chronic pain in abdomen and back that has gotten worse today. Patient is anxious upon arrival. Patient's tachypnic and tachycardic. Patient alert and oriented.

## 2014-06-08 NOTE — ED Provider Notes (Signed)
CSN: [id number]     Arrival date & time 06/08/14  2056 History   First MD Initiated Contact with Patient 06/08/14 2107     Chief Complaint  Patient presents with  . Tachycardia     (Consider location/radiation/quality/duration/timing/severity/associated sxs/prior Treatment) The history is provided by the patient and medical records. No language interpreter was used.     Jesse Long is a 34 y.o. male  with a hx of polysubstance abuse presents to the Emergency Department complaining of gradual, persistent, progressively worsening abd pain and "pulsating mass in my abd."  Pt reports the mass in his abd has been present for > 2 years.  He reports he was cooking dinner tonight when he suddenly began to feel panicky with SOB, chest pain, abd pain, hyperventilation and tingling in his hands and feet.  Pt reports this has happened one time before and he was diagnosed with an anxiety attack.  He reports he has not seen anyone about his abd since he does not have insurance.  Pt reports he is absolutely sure that his aorta has ruptured.  Nothing seems to make patients symptoms better or worse.  He denies fever, chills, headache, neck pain, nausea, vomiting, diarrhea, weakness, dizziness, syncope, dysuria.  Past Medical History  Diagnosis Date  . No pertinent past medical history   . Polysubstance abuse    Past Surgical History  Procedure Laterality Date  . I&d extremity  03/08/2012    Procedure: IRRIGATION AND DEBRIDEMENT EXTREMITY;  Surgeon: Velna Ochs, MD;  Location: [facility] OR;  Service: Orthopedics;  Laterality: Left;   No family history on file. History  Substance Use Topics  . Smoking status: Current Every Day Smoker -- 0.25 packs/day    Types: Cigarettes  . Smokeless tobacco: Not on file  . Alcohol Use: No    Review of Systems  Constitutional: Negative for fever, diaphoresis, appetite change, fatigue and unexpected weight change.  HENT: Negative for mouth sores.   Eyes: Negative  for visual disturbance.  Respiratory: Positive for shortness of breath. Negative for cough, chest tightness and wheezing.   Cardiovascular: Negative for chest pain.  Gastrointestinal: Positive for nausea and abdominal pain. Negative for vomiting, diarrhea and constipation.  Endocrine: Negative for polydipsia, polyphagia and polyuria.  Genitourinary: Negative for dysuria, urgency, frequency and hematuria.  Musculoskeletal: Negative for back pain and neck stiffness.  Skin: Negative for rash.  Allergic/Immunologic: Negative for immunocompromised state.  Neurological: Negative for syncope, light-headedness and headaches.  Hematological: Does not bruise/bleed easily.  Psychiatric/Behavioral: Negative for sleep disturbance. The patient is nervous/anxious.       Allergies  Review of patient's allergies indicates no known allergies.  Home Medications   Prior to Admission medications   Medication Sig Start Date End Date Taking? Authorizing Provider  acetaminophen (TYLENOL) 500 MG tablet Take 500 mg by mouth every 6 (six) hours as needed for moderate pain.   Yes Historical Provider, MD  Aspirin-Acetaminophen-Caffeine (GOODY HEADACHE PO) Take 1 Package by mouth daily as needed (for pain).   Yes Historical Provider, MD  Multiple Vitamin (MULTIVITAMIN WITH MINERALS) TABS tablet Take 1 tablet by mouth once a week.   Yes Historical Provider, MD  polyethylene glycol (MIRALAX / GLYCOLAX) packet Take 17 g by mouth daily as needed for moderate constipation.   Yes Historical Provider, MD  Probiotic Product (PROBIOTIC PO) Take 1 capsule by mouth as needed (for colon health).   Yes Historical Provider, MD  bisacodyl (DULCOLAX) 5 MG EC tablet Take 5 mg  by mouth daily as needed for moderate constipation.    Historical Provider, MD   BP 99/46 mmHg  Pulse 76  Temp(Src) 99.5 F (37.5 C) (Axillary)  Resp 18  SpO2 96% Physical Exam  Constitutional: He is oriented to person, place, and time. He appears  well-developed and well-nourished. No distress.  Awake, alert, nontoxic appearance  HENT:  Head: Normocephalic and atraumatic.  Mouth/Throat: Oropharynx is clear and moist. No oropharyngeal exudate.  Eyes: Conjunctivae are normal. No scleral icterus.  Neck: Normal range of motion. Neck supple.  Cardiovascular: Regular rhythm, normal heart sounds and intact distal pulses.  Tachycardia present.   No murmur heard. Pulses:      Radial pulses are 2+ on the right side, and 2+ on the left side.       Dorsalis pedis pulses are 2+ on the right side, and 2+ on the left side.       Posterior tibial pulses are 2+ on the right side, and 2+ on the left side.  Capillary refill < 3 sec  Pulmonary/Chest: Effort normal and breath sounds normal. No respiratory distress. He has no wheezes.  Equal chest expansion  Abdominal: Soft. Bowel sounds are normal. He exhibits pulsatile midline mass. He exhibits no distension and no mass. There is no tenderness. There is no rebound and no guarding.  Abdominal aorta is palpable, it does not feel enlarged and pt is very thin abd is soft and minimally tender  Musculoskeletal: Normal range of motion. He exhibits no edema.  Neurological: He is alert and oriented to person, place, and time.  Speech is clear and goal oriented Moves extremities without ataxia  Skin: Skin is warm and dry. He is not diaphoretic. No erythema.  No mottling   Psychiatric: His mood appears anxious.  Pt is hyperventilating  Nursing note and vitals reviewed.   ED Course  Procedures (including critical care time) Labs Review Labs Reviewed  CBC WITH DIFFERENTIAL/PLATELET - Abnormal; Notable for the following:    HCT 38.9 (*)    All other components within normal limits  COMPREHENSIVE METABOLIC PANEL - Abnormal; Notable for the following:    Glucose, Bld 214 (*)    GFR calc non Af Amer 73 (*)    GFR calc Af Amer 85 (*)    All other components within normal limits  URINALYSIS, ROUTINE W  REFLEX MICROSCOPIC - Abnormal; Notable for the following:    Ketones, ur 15 (*)    Protein, ur 100 (*)    All other components within normal limits  URINE RAPID DRUG SCREEN (HOSP PERFORMED) - Abnormal; Notable for the following:    Tetrahydrocannabinol POSITIVE (*)    All other components within normal limits  PROTIME-INR  APTT  TROPONIN I  ETHANOL  LIPASE, BLOOD  URINE MICROSCOPIC-ADD ON  TYPE AND SCREEN  ABO/RH    Imaging Review Ct Angio Chest Aortic Dissect W &/or W/o  06/09/2014   CLINICAL DATA:  Tachycardia, abdominal pain. Palpable aorta. History of polysubstance abuse.  EXAM: CT ANGIOGRAPHY CHEST, ABDOMEN AND PELVIS  TECHNIQUE: Multidetector CT imaging through the chest, abdomen and pelvis was performed using the standard protocol during bolus administration of intravenous contrast. Multiplanar reconstructed images and MIPs were obtained and reviewed to evaluate the vascular anatomy.  CONTRAST:  100mL OMNIPAQUE IOHEXOL 350 MG/ML SOLN  COMPARISON:  None.  FINDINGS: CTA CHEST FINDINGS  There is no aortic hematoma. Thoracic aorta is normal in caliber. There is no dissection or aneurysm. Mild pectus deformity of  the thoracic cage causes leftward shift of the mediastinum. There is a conventional branching pattern from the aortic arch. There are no filling defects in the central pulmonary arteries. There is no pleural or pericardial effusion. No mediastinal or hilar adenopathy. Allowing for mild motion artifact, the lungs are clear.  Review of the MIP images confirms the above findings.  CTA ABDOMEN AND PELVIS FINDINGS  The abdominal aorta is normal in caliber. There is no dissection. The celiac, superior mesenteric, and inferior mesenteric arteries are patent. Single bilateral renal arteries are patent. The common, internal, and external iliac vasculature is widely patent.  Arterial phase imaging of the liver, spleen, gallbladder, adrenal glands, pancreas, and kidneys are normal. Limited bowel  evaluation given lack of enteric contrast and paucity of intra-abdominal fat. There are no dilated or thickened bowel loops. There is a moderate volume of colonic stool. The appendix is tentatively identified and normal. No free air, free fluid, or intra-abdominal fluid collection.  Within the pelvis the urinary bladder is minimally distended not well evaluated. Prostate gland is normal in size with central prostatic calcifications.  Review of the MIP images confirms the above findings.  Review of the osseous structures of the chest abdomen pelvis demonstrates scattered bone islands in minimal degenerative change in the spine. There are no acute osseous abnormalities.  IMPRESSION: 1. Normal caliber thoracoabdominal aorta without aneurysm or dissection. 2. No acute abnormality in the chest, abdomen or pelvis. Moderate volume of retained stool throughout the colon can be seen with constipation.   Electronically Signed   By: Rubye Oaks M.D.   On: 06/09/2014 01:09   Ct Cta Abd/pel W/cm &/or W/o Cm  06/09/2014   CLINICAL DATA:  Tachycardia, abdominal pain. Palpable aorta. History of polysubstance abuse.  EXAM: CT ANGIOGRAPHY CHEST, ABDOMEN AND PELVIS  TECHNIQUE: Multidetector CT imaging through the chest, abdomen and pelvis was performed using the standard protocol during bolus administration of intravenous contrast. Multiplanar reconstructed images and MIPs were obtained and reviewed to evaluate the vascular anatomy.  CONTRAST:  OMNIPAQUE IOHEXOL 350 MG/ML SOLN  COMPARISON:  None.  FINDINGS: CTA CHEST FINDINGS  There is no aortic hematoma. Thoracic aorta is normal in caliber. There is no dissection or aneurysm. Mild pectus deformity of the thoracic cage causes leftward shift of the mediastinum. There is a conventional branching pattern from the aortic arch. There are no filling defects in the central pulmonary arteries. There is no pleural or pericardial effusion. No mediastinal or hilar adenopathy.  Allowing for mild motion artifact, the lungs are clear.  Review of the MIP images confirms the above findings.  CTA ABDOMEN AND PELVIS FINDINGS  The abdominal aorta is normal in caliber. There is no dissection. The celiac, superior mesenteric, and inferior mesenteric arteries are patent. Single bilateral renal arteries are patent. The common, internal, and external iliac vasculature is widely patent.  Arterial phase imaging of the liver, spleen, gallbladder, adrenal glands, pancreas, and kidneys are normal. Limited bowel evaluation given lack of enteric contrast and paucity of intra-abdominal fat. There are no dilated or thickened bowel loops. There is a moderate volume of colonic stool. The appendix is tentatively identified and normal. No free air, free fluid, or intra-abdominal fluid collection.  Within the pelvis the urinary bladder is minimally distended not well evaluated. Prostate gland is normal in size with central prostatic calcifications.  Review of the MIP images confirms the above findings.  Review of the osseous structures of the chest abdomen pelvis demonstrates scattered bone  islands in minimal degenerative change in the spine. There are no acute osseous abnormalities.  IMPRESSION: 1. Normal caliber thoracoabdominal aorta without aneurysm or dissection. 2. No acute abnormality in the chest, abdomen or pelvis. Moderate volume of retained stool throughout the colon can be seen with constipation.   Electronically Signed   By: Rubye Oaks M.D.   On: 06/09/2014 01:09     EKG Interpretation   Date/Time:  Wednesday June 08 2014 20:58:36 EST Ventricular Rate:  132 PR Interval:  142 QRS Duration: 114 QT Interval:  308 QTC Calculation: 456 R Axis:   86 Text Interpretation:  Sinus tachycardia LAE, consider biatrial enlargement  Incomplete right bundle branch block Artifact in lead(s) I II III aVR aVL  aVF V2 V5 V6 Confirmed by Lincoln Brigham [phone]) on 06/08/2014 9:25:58 PM      MDM    Final diagnoses:  Tachycardia  Anxiety  Abdominal pain, acute  Panic attack   Jesse Long presents with tachycardia, chest pain, shortness of breath, hyperventilation, anxiety with associated abdominal pain and complaints of pulsatile mass in his abdomen. Exam patient aorta is palpable in his abdomen but does not feel enlarged. His abdomen is soft and nontender.  Patient is clearly very anxious. Will give Ativan and reassess.  11PM Labs reassuring. Elevation of blood glucose without anion gap noted. Patient has been given several fluid boluses here in the emergency department. Drug screen, UA and CT pending.  1:29 AM Urinalysis with evidence of urinary tract infection and drug screen positive for marijuana but not cocaine.  CT scan without dissection of the thoracic or abdominal aorta. Patient is resting comfortably without tachycardia. He reports he feels well at this time. Discussed findings of elevated glucose. Also discussed anxiety, stress reducing mechanisms and management of panic attacks. Patient is to see a primary care physician within 3 days.  I have personally reviewed patient's vitals, nursing note and any pertinent labs or imaging.  I performed an undressed physical exam.    It has been determined that no acute conditions requiring further emergency intervention are present at this time. The patient/guardian have been advised of the diagnosis and plan. I reviewed all labs and imaging including any potential incidental findings. We have discussed signs and symptoms that warrant return to the ED and they are listed in the discharge instructions.    Vital signs are stable at discharge.   BP 99/46 mmHg  Pulse 76  Temp(Src) 99.5 F (37.5 C) (Axillary)  Resp 18  SpO2 96%        Dierdre Forth, PA-C 06/09/14 0130  Tilden Fossa, MD 06/09/14 1451

## 2014-06-08 NOTE — ED Notes (Signed)
Phlebotomy at the bedside  

## 2014-06-09 ENCOUNTER — Encounter (HOSPITAL_COMMUNITY): Payer: Self-pay | Admitting: Radiology

## 2014-06-09 ENCOUNTER — Emergency Department (HOSPITAL_COMMUNITY): Payer: 59

## 2014-06-09 LAB — URINALYSIS, ROUTINE W REFLEX MICROSCOPIC
Bilirubin Urine: NEGATIVE
Glucose, UA: NEGATIVE mg/dL
Hgb urine dipstick: NEGATIVE
Ketones, ur: 15 mg/dL — AB
LEUKOCYTES UA: NEGATIVE
Nitrite: NEGATIVE
PH: 7 (ref 5.0–8.0)
Protein, ur: 100 mg/dL — AB
Specific Gravity, Urine: 1.028 (ref 1.005–1.030)
UROBILINOGEN UA: 0.2 mg/dL (ref 0.0–1.0)

## 2014-06-09 LAB — RAPID URINE DRUG SCREEN, HOSP PERFORMED
AMPHETAMINES: NOT DETECTED
BARBITURATES: NOT DETECTED
BENZODIAZEPINES: NOT DETECTED
COCAINE: NOT DETECTED
OPIATES: NOT DETECTED
Tetrahydrocannabinol: POSITIVE — AB

## 2014-06-09 LAB — URINE MICROSCOPIC-ADD ON

## 2014-06-09 MED ORDER — SODIUM CHLORIDE 0.9 % IV BOLUS (SEPSIS)
1000.0000 mL | Freq: Once | INTRAVENOUS | Status: AC
  Administered 2014-06-09: 1000 mL via INTRAVENOUS

## 2014-06-09 MED ORDER — IOHEXOL 350 MG/ML SOLN
100.0000 mL | Freq: Once | INTRAVENOUS | Status: AC | PRN
  Administered 2014-06-09: 100 mL via INTRAVENOUS

## 2014-06-09 NOTE — Discharge Instructions (Signed)
1. Medications: usual home medications 2. Treatment: rest, drink plenty of fluids, stress reduction mechanisms as listed below 3. Follow Up: Please followup with your primary doctor in 3 days for discussion of your diagnoses and further evaluation after today's visit; if you do not have a primary care doctor use the resource guide provided to find one; Please return to the ER for worsening or returning symptoms, other concerns   Panic Attacks Panic attacks are sudden, short-livedsurges of severe anxiety, fear, or discomfort. They may occur for no reason when you are relaxed, when you are anxious, or when you are sleeping. Panic attacks may occur for a number of reasons:   Healthy people occasionally have panic attacks in extreme, life-threatening situations, such as war or natural disasters. Normal anxiety is a protective mechanism of the body that helps us react to danger (fight or flight response).  Panic attacks are often seen with anxiety disorders, such as panic disorder, social anxiety disorder, generalized anxiety disorder, and phobias. Anxiety disorders cause excessive or uncontrollable anxiety. They may interfere with your relationships or other life activities.  Panic attacks are sometimes seen with other mental illnesses, such as depression and posttraumatic stress disorder.  Certain medical conditions, prescription medicines, and drugs of abuse can cause panic attacks. SYMPTOMS  Panic attacks start suddenly, peak within 20 minutes, and are accompanied by four or more of the following symptoms:  Pounding heart or fast heart rate (palpitations).  Sweating.  Trembling or shaking.  Shortness of breath or feeling smothered.  Feeling choked.  Chest pain or discomfort.  Nausea or strange feeling in your stomach.  Dizziness, light-headedness, or feeling like you will faint.  Chills or hot flushes.  Numbness or tingling in your lips or hands and feet.  Feeling that things  are not real or feeling that you are not yourself.  Fear of losing control or going crazy.  Fear of dying. Some of these symptoms can mimic serious medical conditions. For example, you may think you are having a heart attack. Although panic attacks can be very scary, they are not life threatening. DIAGNOSIS  Panic attacks are diagnosed through an assessment by your health care provider. Your health care provider will ask questions about your symptoms, such as where and when they occurred. Your health care provider will also ask about your medical history and use of alcohol and drugs, including prescription medicines. Your health care provider may order blood tests or other studies to rule out a serious medical condition. Your health care provider may refer you to a mental health professional for further evaluation. TREATMENT   Most healthy people who have one or two panic attacks in an extreme, life-threatening situation will not require treatment.  The treatment for panic attacks associated with anxiety disorders or other mental illness typically involves counseling with a mental health professional, medicine, or a combination of both. Your health care provider will help determine what treatment is best for you.  Panic attacks due to physical illness usually go away with treatment of the illness. If prescription medicine is causing panic attacks, talk with your health care provider about stopping the medicine, decreasing the dose, or substituting another medicine.  Panic attacks due to alcohol or drug abuse go away with abstinence. Some adults need professional help in order to stop drinking or using drugs. HOME CARE INSTRUCTIONS   Take all medicines as directed by your health care provider.   Schedule and attend follow-up visits as directed by your health  care provider. It is important to keep all your appointments. SEEK MEDICAL CARE IF:  You are not able to take your medicines as  prescribed.  Your symptoms do not improve or get worse. SEEK IMMEDIATE MEDICAL CARE IF:   You experience panic attack symptoms that are different than your usual symptoms.  You have serious thoughts about hurting yourself or others.  You are taking medicine for panic attacks and have a serious side effect. MAKE SURE YOU:  Understand these instructions.  Will watch your condition.  Will get help right away if you are not doing well or get worse. Document Released: 04/22/2005 Document Revised: 04/27/2013 Document Reviewed: 12/04/2012 [facility] Patient Information 2015 [place], [place]. This information is not intended to replace advice given to you by your health care provider. Make sure you discuss any questions you have with your health care provider.

## 2014-06-10 NOTE — ED Notes (Signed)
Wasted 1.5 mg of Ativan in the sink witnessed by Lizabeth LeydenMeghan Tilley, RN

## 2014-08-12 ENCOUNTER — Encounter (HOSPITAL_COMMUNITY): Payer: Self-pay | Admitting: *Deleted

## 2014-08-12 ENCOUNTER — Emergency Department (HOSPITAL_COMMUNITY)
Admission: EM | Admit: 2014-08-12 | Discharge: 2014-08-12 | Disposition: A | Discharge status: Home / Self Care | Payer: 59 | Attending: Emergency Medicine | Admitting: Emergency Medicine

## 2014-08-12 ENCOUNTER — Emergency Department (HOSPITAL_COMMUNITY): Payer: 59

## 2014-08-12 DIAGNOSIS — K5900 Constipation, unspecified: Secondary | ICD-10-CM | POA: Insufficient documentation

## 2014-08-12 DIAGNOSIS — M546 Pain in thoracic spine: Secondary | ICD-10-CM | POA: Diagnosis present

## 2014-08-12 DIAGNOSIS — Z87891 Personal history of nicotine dependence: Secondary | ICD-10-CM | POA: Insufficient documentation

## 2014-08-12 DIAGNOSIS — R0789 Other chest pain: Secondary | ICD-10-CM | POA: Insufficient documentation

## 2014-08-12 LAB — COMPREHENSIVE METABOLIC PANEL
ALBUMIN: 4.4 g/dL (ref 3.5–5.2)
ALT: 18 U/L (ref 0–53)
AST: 21 U/L (ref 0–37)
Alkaline Phosphatase: 45 U/L (ref 39–117)
Anion gap: 9 (ref 5–15)
BUN: 11 mg/dL (ref 6–23)
CALCIUM: 9.3 mg/dL (ref 8.4–10.5)
CO2: 28 mmol/L (ref 19–32)
Chloride: 102 mmol/L (ref 96–112)
Creatinine, Ser: 0.97 mg/dL (ref 0.50–1.35)
GFR calc non Af Amer: >90 mL/min (ref >90)
GLUCOSE: 112 mg/dL — AB (ref 70–99)
POTASSIUM: 3.2 mmol/L — AB (ref 3.5–5.1)
Sodium: 139 mmol/L (ref 135–145)
TOTAL PROTEIN: 7 g/dL (ref 6.0–8.3)
Total Bilirubin: 1.3 mg/dL — ABNORMAL HIGH (ref 0.3–1.2)

## 2014-08-12 LAB — CBC WITH DIFFERENTIAL/PLATELET
Basophils Absolute: 0 10*3/uL (ref 0.0–0.1)
Basophils Relative: 1 % (ref 0–1)
Eosinophils Absolute: 0.2 10*3/uL (ref 0.0–0.7)
Eosinophils Relative: 4 % (ref 0–5)
HCT: 39.2 % (ref 39.0–52.0)
HEMOGLOBIN: 13.4 g/dL (ref 13.0–17.0)
LYMPHS PCT: 39 % (ref 12–46)
Lymphs Abs: 2.1 10*3/uL (ref 0.7–4.0)
MCH: 30.6 pg (ref 26.0–34.0)
MCHC: 34.2 g/dL (ref 30.0–36.0)
MCV: 89.5 fL (ref 78.0–100.0)
MONOS PCT: 11 % (ref 3–12)
Monocytes Absolute: 0.6 10*3/uL (ref 0.1–1.0)
NEUTROS ABS: 2.4 10*3/uL (ref 1.7–7.7)
NEUTROS PCT: 46 % (ref 43–77)
PLATELETS: 209 10*3/uL (ref 150–400)
RBC: 4.38 MIL/uL (ref 4.22–5.81)
RDW: 11.9 % (ref 11.5–15.5)
WBC: 5.3 10*3/uL (ref 4.0–10.5)

## 2014-08-12 LAB — URINALYSIS, ROUTINE W REFLEX MICROSCOPIC
BILIRUBIN URINE: NEGATIVE
Glucose, UA: NEGATIVE mg/dL
Hgb urine dipstick: NEGATIVE
KETONES UR: NEGATIVE mg/dL
Leukocytes, UA: NEGATIVE
Nitrite: NEGATIVE
Protein, ur: NEGATIVE mg/dL
Specific Gravity, Urine: 1.006 (ref 1.005–1.030)
Urobilinogen, UA: 0.2 mg/dL (ref 0.0–1.0)
pH: 6.5 (ref 5.0–8.0)

## 2014-08-12 MED ORDER — GI COCKTAIL ~~LOC~~
30.0000 mL | Freq: Once | ORAL | Status: AC
  Administered 2014-08-12: 30 mL via ORAL
  Filled 2014-08-12: qty 30

## 2014-08-12 NOTE — ED Notes (Signed)
Discharge instructions given, voiced understanding 

## 2014-08-12 NOTE — ED Provider Notes (Signed)
CSN: [id number]     Arrival date & time 08/12/14  0334 History   First MD Initiated Contact with Patient 08/12/14 [id number]     Chief Complaint  Patient presents with  . Abdominal Pain  . Back Pain     (Consider location/radiation/quality/duration/timing/severity/associated sxs/prior Treatment) Patient is a 34 y.o. male presenting with abdominal pain and back pain. The history is provided by the patient. No language interpreter was used.  Abdominal Pain Pain location:  Epigastric Pain quality: burning   Associated symptoms: chest pain and constipation   Associated symptoms: no chills, no cough, no fever, no shortness of breath and no vomiting   Associated symptoms comment:  The patient has multiple complaints. He reports sharp "stabbing" pain in his back for the past 3-4 days at bilateral mid-back. No worse with movement, breathing. No cough, SOB. He complains of pressure like chest pain that became a burning pain tonight. He has had intermittent abdominal pain focally to LLQ since yesterday that is no resolved. No fever. He has ongoing issues with constipation that is unrelieved with Miralax, hydration, Dulcolax. He feels his abdomen is bloated.   Back Pain Associated symptoms: abdominal pain and chest pain   Associated symptoms: no fever     Past Medical History  Diagnosis Date  . No pertinent past medical history   . Polysubstance abuse    Past Surgical History  Procedure Laterality Date  . I&d extremity  03/08/2012    Procedure: IRRIGATION AND DEBRIDEMENT EXTREMITY;  Surgeon: Velna Ochs, MD;  Location: [facility] OR;  Service: Orthopedics;  Laterality: Left;   No family history on file. History  Substance Use Topics  . Smoking status: Former Smoker -- 0.25 packs/day    Types: Cigarettes    Quit date: 08/11/2013  . Smokeless tobacco: Never Used  . Alcohol Use: No    Review of Systems  Constitutional: Negative for fever and chills.  HENT: Negative.   Respiratory: Negative.   Negative for cough and shortness of breath.   Cardiovascular: Positive for chest pain. Negative for palpitations.  Gastrointestinal: Positive for abdominal pain and constipation. Negative for vomiting.  Musculoskeletal: Positive for back pain.  Neurological: Negative.       Allergies  Review of patient's allergies indicates no known allergies.  Home Medications   Prior to Admission medications   Medication Sig Start Date End Date Taking? Authorizing Provider  acetaminophen (TYLENOL) 500 MG tablet Take 500 mg by mouth every 6 (six) hours as needed for moderate pain.    Historical Provider, MD  Aspirin-Acetaminophen-Caffeine (GOODY HEADACHE PO) Take 1 Package by mouth daily as needed (for pain).    Historical Provider, MD  bisacodyl (DULCOLAX) 5 MG EC tablet Take 5 mg by mouth daily as needed for moderate constipation.    Historical Provider, MD  Multiple Vitamin (MULTIVITAMIN WITH MINERALS) TABS tablet Take 1 tablet by mouth once a week.    Historical Provider, MD  polyethylene glycol (MIRALAX / GLYCOLAX) packet Take 17 g by mouth daily as needed for moderate constipation.    Historical Provider, MD  Probiotic Product (PROBIOTIC PO) Take 1 capsule by mouth as needed (for colon health).    Historical Provider, MD   BP 108/67 mmHg  Pulse 61  Temp(Src) 98.2 F (36.8 C) (Oral)  Resp 20  Ht  (1.88 m)  Wt 170 lb (77.111 kg)  BMI 21.82 kg/m2  SpO2 96% Physical Exam  Constitutional: He is oriented to person, place, and time. He appears  well-developed and well-nourished.  HENT:  Head: Normocephalic.  Neck: Normal range of motion. Neck supple.  Cardiovascular: Normal rate and regular rhythm.   Pulmonary/Chest: Effort normal and breath sounds normal. He has no wheezes. He has no rales. He exhibits no tenderness.  Abdominal: Soft. Bowel sounds are normal. There is no tenderness. There is no rebound and no guarding.  Musculoskeletal: Normal range of motion. He exhibits no edema.   Neurological: He is alert and oriented to person, place, and time.  Skin: Skin is warm and dry. No rash noted.  Psychiatric: He has a normal mood and affect.    ED Course  Procedures (including critical care time) Labs Review Labs Reviewed  COMPREHENSIVE METABOLIC PANEL - Abnormal; Notable for the following:    Potassium 3.2 (*)    Glucose, Bld 112 (*)    Total Bilirubin 1.3 (*)    All other components within normal limits  CBC WITH DIFFERENTIAL/PLATELET  URINALYSIS, ROUTINE W REFLEX MICROSCOPIC   Results for orders placed or performed during the hospital encounter of 08/12/14  CBC with Differential  Result Value Ref Range   WBC 5.3 4.0 - 10.5 K/uL   RBC 4.38 4.22 - 5.81 MIL/uL   Hemoglobin 13.4 13.0 - 17.0 g/dL   HCT 09.8 11.9 - 14.7 %   MCV 89.5 78.0 - 100.0 fL   MCH 30.6 26.0 - 34.0 pg   MCHC 34.2 30.0 - 36.0 g/dL   RDW 82.9 56.2 - 13.0 %   Platelets 209 150 - 400 K/uL   Neutrophils Relative % 46 43 - 77 %   Neutro Abs 2.4 1.7 - 7.7 K/uL   Lymphocytes Relative 39 12 - 46 %   Lymphs Abs 2.1 0.7 - 4.0 K/uL   Monocytes Relative 11 3 - 12 %   Monocytes Absolute 0.6 0.1 - 1.0 K/uL   Eosinophils Relative 4 0 - 5 %   Eosinophils Absolute 0.2 0.0 - 0.7 K/uL   Basophils Relative 1 0 - 1 %   Basophils Absolute 0.0 0.0 - 0.1 K/uL  Comprehensive metabolic panel  Result Value Ref Range   Sodium 139 135 - 145 mmol/L   Potassium 3.2 (L) 3.5 - 5.1 mmol/L   Chloride 102 96 - 112 mmol/L   CO2 28 19 - 32 mmol/L   Glucose, Bld 112 (H) 70 - 99 mg/dL   BUN 11 6 - 23 mg/dL   Creatinine, Ser 8.65 0.50 - 1.35 mg/dL   Calcium 9.3 8.4 - 78.4 mg/dL   Total Protein 7.0 6.0 - 8.3 g/dL   Albumin 4.4 3.5 - 5.2 g/dL   AST 21 0 - 37 U/L   ALT 18 0 - 53 U/L   Alkaline Phosphatase 45 39 - 117 U/L   Total Bilirubin 1.3 (H) 0.3 - 1.2 mg/dL   GFR calc non Af Amer >90 >90 mL/min   GFR calc Af Amer >90 >90 mL/min   Anion gap 9 5 - 15  Urinalysis, Routine w reflex microscopic  Result Value Ref  Range   Color, Urine YELLOW YELLOW   APPearance CLEAR CLEAR   Specific Gravity, Urine 1.006 1.005 - 1.030   pH 6.5 5.0 - 8.0   Glucose, UA NEGATIVE NEGATIVE mg/dL   Hgb urine dipstick NEGATIVE NEGATIVE   Bilirubin Urine NEGATIVE NEGATIVE   Ketones, ur NEGATIVE NEGATIVE mg/dL   Protein, ur NEGATIVE NEGATIVE mg/dL   Urobilinogen, UA 0.2 0.0 - 1.0 mg/dL   Nitrite NEGATIVE NEGATIVE   Leukocytes,  UA NEGATIVE NEGATIVE   Dg Abd Acute W/chest  08/12/2014   CLINICAL DATA:  Abdominal pain and back pain for 3 days. Right-sided chest pain and back pain.  EXAM: DG ABDOMEN ACUTE W/ 1V CHEST  COMPARISON:  CT 06/09/2014.  FINDINGS: There is no evidence of dilated bowel loops or free intraperitoneal air. No radiopaque calculi or other significant radiographic abnormality is seen. Heart size and mediastinal contours are within normal limits. Both lungs are clear.  IMPRESSION: Negative abdominal radiographs.  No acute cardiopulmonary disease.   Electronically Signed   By: Burman NievesWilliam  Stevens M.D.   On: 08/12/2014 06:01    Imaging Review No results found.   EKG Interpretation None      MDM   Final diagnoses:  None    1. Chest pain 2. Back pain  No evidence ACS in 34 yo patient; no tachycardia/hypoxia, PERC negative for PE. He is very well appearing, NAD, normal VS. He can be discharged home to follow up with PCP for recheck next week. Recommend Mag Citrate for constipation, which is chronic.      Elpidio AnisShari Garion Wempe, PA-C 08/12/14 [phone]  Marisa Severinlga Otter, MD 08/12/14 [phone]

## 2014-08-12 NOTE — ED Notes (Signed)
Pain to abd (has been having constipation for the last several months),  States his abd pain causes his back to hurt.  States he has to take a laxative to go to the [facility], also takes Colace when he takes the laxative.

## 2014-08-12 NOTE — Discharge Instructions (Signed)

## 2014-08-12 NOTE — ED Notes (Signed)
Patient with belching.

## 2014-08-24 ENCOUNTER — Other Ambulatory Visit: Payer: Self-pay

## 2014-08-24 ENCOUNTER — Encounter: Payer: Self-pay | Admitting: Family Medicine

## 2014-08-24 ENCOUNTER — Ambulatory Visit
Admission: RE | Admit: 2014-08-24 | Discharge: 2014-08-24 | Disposition: A | Discharge status: Home / Self Care | Payer: 59 | Source: Ambulatory Visit | Attending: Family Medicine | Admitting: Family Medicine

## 2014-08-24 ENCOUNTER — Ambulatory Visit (HOSPITAL_BASED_OUTPATIENT_CLINIC_OR_DEPARTMENT_OTHER): Payer: 59 | Admitting: Family Medicine

## 2014-08-24 VITALS — BP 117/80 | HR 72 | Temp 99.0°F | Resp 20 | Ht 74.0 in | Wt 152.2 lb

## 2014-08-24 DIAGNOSIS — E559 Vitamin D deficiency, unspecified: Secondary | ICD-10-CM

## 2014-08-24 DIAGNOSIS — R634 Abnormal weight loss: Secondary | ICD-10-CM

## 2014-08-24 DIAGNOSIS — F32A Depression, unspecified: Secondary | ICD-10-CM | POA: Insufficient documentation

## 2014-08-24 DIAGNOSIS — R079 Chest pain, unspecified: Secondary | ICD-10-CM | POA: Insufficient documentation

## 2014-08-24 DIAGNOSIS — R7309 Other abnormal glucose: Secondary | ICD-10-CM

## 2014-08-24 DIAGNOSIS — F419 Anxiety disorder, unspecified: Secondary | ICD-10-CM | POA: Diagnosis not present

## 2014-08-24 DIAGNOSIS — R739 Hyperglycemia, unspecified: Secondary | ICD-10-CM

## 2014-08-24 DIAGNOSIS — Z0000 Encounter for general adult medical examination without abnormal findings: Secondary | ICD-10-CM | POA: Diagnosis not present

## 2014-08-24 DIAGNOSIS — K5900 Constipation, unspecified: Secondary | ICD-10-CM | POA: Diagnosis not present

## 2014-08-24 DIAGNOSIS — F329 Major depressive disorder, single episode, unspecified: Secondary | ICD-10-CM

## 2014-08-24 DIAGNOSIS — Z114 Encounter for screening for human immunodeficiency virus [HIV]: Secondary | ICD-10-CM

## 2014-08-24 LAB — HEMOCCULT GUIAC POC 1CARD (OFFICE): Fecal Occult Blood, POC: NEGATIVE

## 2014-08-24 LAB — BASIC METABOLIC PANEL
BUN: 11 mg/dL (ref 6–23)
CO2: 26 mEq/L (ref 19–32)
Calcium: 9.9 mg/dL (ref 8.4–10.5)
Chloride: 100 mEq/L (ref 96–112)
Creat: 0.92 mg/dL (ref 0.50–1.35)
Glucose, Bld: 74 mg/dL (ref 70–99)
POTASSIUM: 4.8 meq/L (ref 3.5–5.3)
SODIUM: 138 meq/L (ref 135–145)

## 2014-08-24 LAB — TSH: TSH: 0.416 u[IU]/mL (ref 0.350–4.500)

## 2014-08-24 MED ORDER — ESCITALOPRAM OXALATE 10 MG PO TABS: 10.0000 mg | ORAL_TABLET | Freq: Every day | ORAL | Status: DC

## 2014-08-24 MED ORDER — HYDROXYZINE HCL 10 MG PO TABS: 10.0000 mg | ORAL_TABLET | Freq: Three times a day (TID) | ORAL | Status: DC | PRN

## 2014-08-24 NOTE — Progress Notes (Addendum)
   Subjective:    Patient ID: Jesse Long, male    DOB: 02/14/1981, 34 y.o.   MRN: [id number] CC: abdominal and L chest pains  HPI 34 yo M new patient:  1. Abdominal and chest pain: x 3 weeks. Sharp pains, 9/10, from b/l upper abdomen and L lower chest backwards to back. Last seconds. Comes 2-3 times per day. Associated with nausea. No emesis, fever, chills.  2. GI problems: was having frequent BM for many years. 1 year ago developed constipation. Has BM every 2-3 days with use of laxative. Changed diet to cut out fried foods, soda w/o improvement. Has hx of hemorrhoid and fissure. Had blood on stool about 1 month ago. No pain with BM. No frank blood per rectum.   3. Mood disorder: reports anxious and depressed mood for most of his life which worsened in middle and high school. Reports anhedonia, fatigue, feeling like he cannot take a deep satisfying breath, panic attacks, fear of serious medical illness (like rupture AAA). First panic attack precipitated by cocaine use. Cocaine x 1 only. Smoked THC for a while, none x 1 years. Denies ETOH. Denies tobacco. He is employed. He is adopted. Reports a stressful and troubled relationship with his adopted mother.   Soc Hx: former smoker  Past Medical History  Diagnosis Date  . No pertinent past medical history   . Polysubstance abuse 05/2013    cocaine tried the night before first panic attack, weed in the past   . Anxiety 2000    anxiety with anger issues. since HS.   . Depression 2000    since HS. no SI. just nothing makes you happy    Family History  Problem Relation Age of Onset  . Adopted: Yes   Review of Systems  Constitutional: Positive for unexpected weight change. Negative for fever and chills.  Eyes: Negative for visual disturbance.  Respiratory: Negative for cough, chest tightness, shortness of breath and wheezing.   Cardiovascular: Positive for chest pain. Negative for palpitations and leg swelling.  Gastrointestinal: Positive  for nausea, abdominal pain, constipation and anal bleeding. Negative for vomiting, diarrhea, blood in stool, abdominal distention and rectal pain.  Musculoskeletal: Negative.   Skin: Negative.   Allergic/Immunologic: Negative for immunocompromised state.  Neurological: Positive for numbness and headaches.  Hematological: Negative for adenopathy. Does not bruise/bleed easily.  Psychiatric/Behavioral: Positive for dysphoric mood. Negative for suicidal ideas and self-injury. The patient is nervous/anxious.    GAD-7: score of 12. 1-3,4. 2-1,2. 3-6,7.     Objective:   Physical Exam BP 117/80 mmHg  Pulse 72  Temp(Src) 99 F (37.2 C) (Oral)  Resp 20  Ht 6\' 2"  (1.88 m)  Wt 152 lb 3.2 oz (69.037 kg)  BMI 19.53 kg/m2  SpO2 100% General appearance: alert, cooperative and no distress Throat: lips, mucosa, and tongue normal; teeth and gums normal Neck: no adenopathy, supple, symmetrical, trachea midline and thyroid not enlarged, symmetric, no tenderness/mass/nodules Back: symmetric, no curvature. ROM normal. No CVA tenderness. Lungs: clear to auscultation bilaterally Heart: regular rate and rhythm, S1, S2 normal, no murmur, click, rub or gallop Abdomen: soft, non-tender; bowel sounds normal; no masses,  no organomegaly  Rectal: skin tag. Normal tone. Non tender. FOBT negative Extremities: extremities normal, atraumatic, no cyanosis or edema  EKG: normal sinus rhythm, RBBB.     Assessment & Plan:

## 2014-08-24 NOTE — Patient Instructions (Signed)
Mr. Jesse Long,  Thank you for coming in today. It was a pleasure meeting you. I look forward to being your primary doctor.   1. Abdominal pains: I suspect irritable bowel syndrome and anxiety as culprits Plan  GI referral due to the associated weight loss Treat mood  3. Mood issues: Anxiety and depression Plan: Counseling with Jamie lexapro 10 mg daily  Atarax as needed for anxiety   You will be called with lab results  F/u in 4 weeks with me for mood   Dr. Armen PickupFunches

## 2014-08-24 NOTE — Assessment & Plan Note (Signed)
Abdominal pains: chronic with unintentional weight loss I suspect irritable bowel syndrome and anxiety as culprits  Plan  GI referral due to the associated weight loss Treat mood

## 2014-08-24 NOTE — Assessment & Plan Note (Signed)
Mood issues: Anxiety and depression Plan: Counseling with Jamie lexapro 10 mg daily  Atarax as needed for anxiety

## 2014-08-24 NOTE — Assessment & Plan Note (Addendum)
Mood issues: Anxiety and depression Plan: Counseling with Jamie lexapro 10 mg daily  Atarax as needed for anxiety   

## 2014-08-24 NOTE — Progress Notes (Signed)
ED follow-up visit. Patient last presented to ED on 08/12/14 for stomach, back, chest pain. Patient indicates he developed "stabbing" pain in abdomen, chest, and mid-back about 2-3 weeks ago.  Pain currently 2/10 left-sided chest-"feels full" per patient. Patient indicates he has constant abdominal bloating. Hx of heart murmur. Dx: at age [age] and hx of panic attacks which started one year ago.

## 2014-08-25 LAB — HIV ANTIBODY (ROUTINE TESTING W REFLEX): HIV: NONREACTIVE

## 2014-08-25 LAB — VITAMIN D 25 HYDROXY (VIT D DEFICIENCY, FRACTURES): VIT D 25 HYDROXY: 18 ng/mL — AB (ref 30–100)

## 2014-08-26 ENCOUNTER — Telehealth: Payer: Self-pay | Admitting: *Deleted

## 2014-08-26 DIAGNOSIS — E559 Vitamin D deficiency, unspecified: Secondary | ICD-10-CM | POA: Insufficient documentation

## 2014-08-26 MED ORDER — VITAMIN D (ERGOCALCIFEROL) 1.25 MG (50000 UNIT) PO CAPS: 50000.0000 [IU] | ORAL_CAPSULE | ORAL | Status: DC

## 2014-08-26 NOTE — Telephone Encounter (Signed)
-----   Message from Dessa PhiJosalyn Funches, MD sent at 08/26/2014  8:47 AM EDT ----- Vit D deficiency with replace with 50,000 U weekly for 8 weeks Normal BMP, normal K+ Normal TSH  Screening HIV negative

## 2014-08-26 NOTE — Assessment & Plan Note (Signed)
Screening HIV negative  

## 2014-08-26 NOTE — Telephone Encounter (Signed)
Unable to contact Pt Phone number not reachable

## 2014-08-26 NOTE — Assessment & Plan Note (Signed)
A: vit D def P: 8 weeks of vit D

## 2014-08-26 NOTE — Addendum Note (Signed)
Addended by: Dessa PhiFUNCHES, Amery Vandenbos on: 08/26/2014 08:48 AM   Modules accepted: Orders

## 2014-09-02 ENCOUNTER — Emergency Department (HOSPITAL_COMMUNITY)
Admission: EM | Admit: 2014-09-02 | Discharge: 2014-09-03 | Discharge status: Against Medical Advice | Payer: 59 | Attending: Emergency Medicine | Admitting: Emergency Medicine

## 2014-09-02 ENCOUNTER — Encounter (HOSPITAL_COMMUNITY): Payer: Self-pay | Admitting: Emergency Medicine

## 2014-09-02 DIAGNOSIS — R51 Headache: Secondary | ICD-10-CM | POA: Diagnosis not present

## 2014-09-02 DIAGNOSIS — M542 Cervicalgia: Secondary | ICD-10-CM | POA: Diagnosis not present

## 2014-09-02 HISTORY — DX: Panic disorder (episodic paroxysmal anxiety): F41.0

## 2014-09-02 LAB — COMPREHENSIVE METABOLIC PANEL
ALT: 15 U/L (ref 0–53)
ANION GAP: 12 (ref 5–15)
AST: 20 U/L (ref 0–37)
Albumin: 4.9 g/dL (ref 3.5–5.2)
Alkaline Phosphatase: 48 U/L (ref 39–117)
BUN: 10 mg/dL (ref 6–23)
CO2: 26 mmol/L (ref 19–32)
Calcium: 9.6 mg/dL (ref 8.4–10.5)
Chloride: 103 mmol/L (ref 96–112)
Creatinine, Ser: 1.05 mg/dL (ref 0.50–1.35)
GFR calc Af Amer: >90 mL/min (ref >90)
Glucose, Bld: 103 mg/dL — ABNORMAL HIGH (ref 70–99)
Potassium: 3.4 mmol/L — ABNORMAL LOW (ref 3.5–5.1)
SODIUM: 141 mmol/L (ref 135–145)
Total Bilirubin: 0.9 mg/dL (ref 0.3–1.2)
Total Protein: 7.5 g/dL (ref 6.0–8.3)

## 2014-09-02 LAB — CBC WITH DIFFERENTIAL/PLATELET
BASOS PCT: 1 % (ref 0–1)
Basophils Absolute: 0 10*3/uL (ref 0.0–0.1)
EOS PCT: 3 % (ref 0–5)
Eosinophils Absolute: 0.2 10*3/uL (ref 0.0–0.7)
HEMATOCRIT: 41.1 % (ref 39.0–52.0)
Hemoglobin: 14.1 g/dL (ref 13.0–17.0)
LYMPHS PCT: 36 % (ref 12–46)
Lymphs Abs: 2.2 10*3/uL (ref 0.7–4.0)
MCH: 30.5 pg (ref 26.0–34.0)
MCHC: 34.3 g/dL (ref 30.0–36.0)
MCV: 89 fL (ref 78.0–100.0)
MONO ABS: 0.4 10*3/uL (ref 0.1–1.0)
Monocytes Relative: 7 % (ref 3–12)
NEUTROS PCT: 53 % (ref 43–77)
Neutro Abs: 3.3 10*3/uL (ref 1.7–7.7)
Platelets: 241 10*3/uL (ref 150–400)
RBC: 4.62 MIL/uL (ref 4.22–5.81)
RDW: 11.8 % (ref 11.5–15.5)
WBC: 6 10*3/uL (ref 4.0–10.5)

## 2014-09-02 NOTE — ED Notes (Signed)
Pt. reports left temporal headache for several weeks and posterior neck pain today with mild nausea  , denies injury or fall, alert and oriented. Denies fever or chills.

## 2014-09-03 NOTE — ED Notes (Signed)
No answer x 3 in waiting area.  Attempted to contact pt by cell phone number and it went to voice mail.

## 2014-09-03 NOTE — ED Notes (Signed)
No answer to go to treatment room.

## 2014-09-03 NOTE — ED Notes (Signed)
No answer x2 

## 2014-09-09 ENCOUNTER — Encounter (HOSPITAL_COMMUNITY): Payer: Self-pay | Admitting: Emergency Medicine

## 2014-09-09 ENCOUNTER — Emergency Department (HOSPITAL_COMMUNITY)
Admission: EM | Admit: 2014-09-09 | Discharge: 2014-09-10 | Disposition: A | Discharge status: Home / Self Care | Payer: 59 | Attending: Emergency Medicine | Admitting: Emergency Medicine

## 2014-09-09 DIAGNOSIS — Z87891 Personal history of nicotine dependence: Secondary | ICD-10-CM | POA: Diagnosis not present

## 2014-09-09 DIAGNOSIS — Z79899 Other long term (current) drug therapy: Secondary | ICD-10-CM | POA: Diagnosis not present

## 2014-09-09 DIAGNOSIS — J323 Chronic sphenoidal sinusitis: Secondary | ICD-10-CM | POA: Diagnosis not present

## 2014-09-09 DIAGNOSIS — F419 Anxiety disorder, unspecified: Secondary | ICD-10-CM | POA: Diagnosis not present

## 2014-09-09 DIAGNOSIS — F329 Major depressive disorder, single episode, unspecified: Secondary | ICD-10-CM | POA: Diagnosis not present

## 2014-09-09 DIAGNOSIS — R519 Headache, unspecified: Secondary | ICD-10-CM

## 2014-09-09 DIAGNOSIS — Z7982 Long term (current) use of aspirin: Secondary | ICD-10-CM | POA: Insufficient documentation

## 2014-09-09 DIAGNOSIS — R51 Headache: Secondary | ICD-10-CM

## 2014-09-09 HISTORY — DX: Palpitations: R00.2

## 2014-09-09 MED ORDER — NAPROXEN 250 MG PO TABS
500.0000 mg | ORAL_TABLET | Freq: Once | ORAL | Status: AC
  Administered 2014-09-09: 500 mg via ORAL
  Filled 2014-09-09: qty 2

## 2014-09-09 NOTE — ED Provider Notes (Signed)
CSN: [id number]     Arrival date & time 09/09/14  2202 History  This chart was scribed for Marisa Severin, MD by Freida Busman, ED Scribe. This patient was seen in room D32C/D32C and the patient's care was started 11:32 PM.    Chief Complaint  Patient presents with  . Headache   The history is provided by the patient. No language interpreter was used.     HPI Comments:  Jesse Long is a 34 y.o. male who presents to the Emergency Department complaining of a left sided HA which he has been experiencing daily for 1 month. He denies acute changes to his pain today but notes pain has progressively worsened since onset.He reports associated stabbing pain in his left eye and an episode of blurry vision today. Pt notes a h/o HAs as a child but states he has not had one in years. He has been taking ASA and tylenol daily with mild relief. He denies recent head injuries/trauma.  Pt wears glasses; his prescription was last updated ~64months ago. He denies fever and chills.  Pt states he is currently undergoing testing for heart testing due to palpitations. He notes he was recently placed on Adarax and Lexapro. He started Lexapro ~ 2 weeks ago but notes it was exacerbating his HAs andstopped taking it 3 days ago. He has not had a follow up with his PCP since stopping but states he has an appointment in 2 weeks.  Pt left without being seen on 09/02/2014, states he was experiencing nausea with his HA. Pt is currently an e-cigarette smoker.    Past Medical History  Diagnosis Date  . No pertinent past medical history   . Polysubstance abuse 05/2013    cocaine tried the night before first panic attack, weed in the past   . Anxiety 2000    anxiety with anger issues. since HS.   . Depression 2000    since HS. no SI. just nothing makes you happy   . Panic attack   . Palpitations    Past Surgical History  Procedure Laterality Date  . I&d extremity  03/08/2012    Procedure: IRRIGATION AND DEBRIDEMENT EXTREMITY;   Surgeon: Velna Ochs, MD;  Location: [facility] OR;  Service: Orthopedics;  Laterality: Left;   Family History  Problem Relation Age of Onset  . Adopted: Yes   History  Substance Use Topics  . Smoking status: Former Smoker -- 0.25 packs/day    Types: Cigarettes    Quit date: 08/11/2013  . Smokeless tobacco: Never Used  . Alcohol Use: No    Review of Systems  Constitutional: Negative for fever and chills.  Eyes: Positive for pain and visual disturbance.  Gastrointestinal: Positive for nausea. Negative for vomiting.  Neurological: Positive for headaches.  All other systems reviewed and are negative.     Allergies  Review of patient's allergies indicates no known allergies.  Home Medications   Prior to Admission medications   Medication Sig Start Date End Date Taking? Authorizing Provider  acetaminophen (TYLENOL) 325 MG tablet Take 650 mg by mouth every 6 (six) hours as needed for headache.   Yes Historical Provider, MD  aspirin 81 MG tablet Take 81 mg by mouth daily.   Yes Historical Provider, MD  escitalopram (LEXAPRO) 10 MG tablet Take 1 tablet (10 mg total) by mouth daily. 08/24/14  Yes Josalyn Funches, MD  hydrOXYzine (ATARAX/VISTARIL) 10 MG tablet Take 1 tablet (10 mg total) by mouth 3 (three) times daily as needed  for anxiety. 08/24/14  Yes Josalyn Funches, MD  Vitamin D, Ergocalciferol, (DRISDOL) 50000 UNITS CAPS capsule Take 1 capsule (50,000 Units total) by mouth every 7 (seven) days. For 8 weeks Patient not taking: Reported on 09/09/2014 08/26/14   Josalyn Funches, MD   BP 111/61 mmHg  Pulse 68  Temp(Src) 99.6 F (37.6 C) (Oral)  Resp 16  Ht 6\' 2"  (1.88 m)  Wt 153 lb 9.6 oz (69.673 kg)  BMI 19.71 kg/m2  SpO2 98% Physical Exam  Constitutional: He is oriented to person, place, and time. He appears well-developed and well-nourished.  HENT:  Head: Normocephalic and atraumatic.  Nose: Nose normal.  Mouth/Throat: Oropharynx is clear and moist.  ttp over left temple,  left maxillary sinus  Eyes: Conjunctivae and EOM are normal. Pupils are equal, round, and reactive to light.  Neck: Normal range of motion. Neck supple. No JVD present. No tracheal deviation present. No thyromegaly present.  Cardiovascular: Normal rate, regular rhythm, normal heart sounds and intact distal pulses.  Exam reveals no gallop and no friction rub.   No murmur heard. Pulmonary/Chest: Effort normal and breath sounds normal. No stridor. No respiratory distress. He has no wheezes. He has no rales. He exhibits no tenderness.  Abdominal: Soft. Bowel sounds are normal. He exhibits no distension and no mass. There is no tenderness. There is no rebound and no guarding.  Musculoskeletal: Normal range of motion. He exhibits no edema or tenderness.  Lymphadenopathy:    He has no cervical adenopathy.  Neurological: He is alert and oriented to person, place, and time. He displays normal reflexes. No cranial nerve deficit. He exhibits normal muscle tone. Coordination normal.  Skin: Skin is warm and dry. No rash noted. No erythema. No pallor.  Psychiatric: He has a normal mood and affect. His behavior is normal. Judgment and thought content normal.  Nursing note and vitals reviewed.   ED Course  Procedures   DIAGNOSTIC STUDIES:  Oxygen Saturation is 99% on RA, normal by my interpretation.    COORDINATION OF CARE:  11:42 PM Will order pain meds and head CT.  Discussed treatment plan with pt at bedside and pt agreed to plan.  2:12 AM Pt updated with results.   Labs Review Labs Reviewed  SEDIMENTATION RATE    Imaging Review Ct Head Wo Contrast  09/10/2014   CLINICAL DATA:  Constant left-sided headache for 1 month.  EXAM: CT HEAD WITHOUT CONTRAST  TECHNIQUE: Contiguous axial images were obtained from the base of the skull through the vertex without intravenous contrast.  COMPARISON:  None.  FINDINGS: There is no intracranial hemorrhage, mass or evidence of acute infarction. Gray matter and  white matter are normal. The ventricles and basal cisterns appear unremarkable.  The bony structures are intact. There is moderate membrane thickening in the ethmoid air cells and frontoethmoid recesses bilaterally. There is an air-fluid level in the left sphenoid sinus.  IMPRESSION: *Paranasal sinus disease, including a left sphenoid sinus air-fluid level. *Normal brain   Electronically Signed   By: Ellery Plunkaniel R Mitchell M.D.   On: 09/10/2014 00:52     EKG Interpretation None      MDM   Final diagnoses:  Chronic sphenoidal sinusitis  Headache, unspecified headache type    34 year old male with daily headaches for the last month.  Over the left temple.  Remote history of headaches as a child.  Patient's been taking daily aspirin and Tylenol without improvement.  Neuro exam is negative.  Patient recently had left that  being seen visit to the emergency department with normal labs.  Will check CT head, sedimentation rate.  Patient has primary care doctor that he can follow-up with.  I personally performed the services described in this documentation, which was scribed in my presence. The recorded information has been reviewed and is accurate.     Marisa Severinlga Haji Delaine, MD 09/10/14 [phone]

## 2014-09-09 NOTE — ED Notes (Signed)
C/o constant L sided headache and stabbing pain to L eye x 1 month.  Taking OTC meds without relief.

## 2014-09-10 ENCOUNTER — Emergency Department (HOSPITAL_COMMUNITY): Payer: 59

## 2014-09-10 LAB — SEDIMENTATION RATE: Sed Rate: 1 mm/hr (ref 0–16)

## 2014-09-10 MED ORDER — AMOXICILLIN-POT CLAVULANATE 875-125 MG PO TABS: 1.0000 | ORAL_TABLET | Freq: Two times a day (BID) | ORAL | Status: DC

## 2014-09-10 MED ORDER — NAPROXEN 500 MG PO TABS: 500.0000 mg | ORAL_TABLET | Freq: Two times a day (BID) | ORAL | Status: DC

## 2014-09-10 MED ORDER — FLUTICASONE PROPIONATE 50 MCG/ACT NA SUSP
2.0000 | Freq: Every day | NASAL | Status: DC
Start: 2014-09-10 — End: 2014-09-10
  Administered 2014-09-10: 2 via NASAL
  Filled 2014-09-10: qty 16

## 2014-09-10 MED ORDER — FLUTICASONE PROPIONATE 50 MCG/ACT NA SUSP: 2.0000 | Freq: Every day | NASAL | Status: DC

## 2014-09-10 MED ORDER — AMOXICILLIN-POT CLAVULANATE 875-125 MG PO TABS
1.0000 | ORAL_TABLET | Freq: Once | ORAL | Status: AC
  Administered 2014-09-10: 1 via ORAL
  Filled 2014-09-10: qty 1

## 2014-09-10 NOTE — Discharge Instructions (Signed)
Your CAT scan showed sinusitis on the left side.  This most likely is causing your headache.  Use medication as prescribed.  Follow-up with your Dr. for recheck.  [facility] for worsening condition or new concerning symptoms. Sinus Headache A sinus headache is when your sinuses become clogged or swollen. Sinus headaches can range from mild to severe.  CAUSES A sinus headache can have different causes, such as:  Colds.  Sinus infections.  Allergies. SYMPTOMS  Symptoms of a sinus headache may vary and can include:  Headache.  Pain or pressure in the face.  Congested or runny nose.  Fever.  Inability to smell.  Pain in upper teeth. Weather changes can make symptoms worse. TREATMENT  The treatment of a sinus headache depends on the cause.  Sinus pain caused by a sinus infection may be treated with antibiotic medicine.  Sinus pain caused by allergies may be helped by allergy medicines (antihistamines) and medicated nasal sprays.  Sinus pain caused by congestion may be helped by flushing the nose and sinuses with saline solution. HOME CARE INSTRUCTIONS   If antibiotics are prescribed, take them as directed. Finish them even if you start to feel better.  Only take over-the-counter or prescription medicines for pain, discomfort, or fever as directed by your caregiver.  If you have congestion, use a nasal spray to help reduce pressure. SEEK IMMEDIATE MEDICAL CARE IF:  You have a fever.  You have headaches more than once a week.  You have sensitivity to light or sound.  You have repeated nausea and vomiting.  You have vision problems.  You have sudden, severe pain in your face or head.  You have a seizure.  You are confused.  Your sinus headaches do not get better after treatment. Many people think they have a sinus headache when they actually have migraines or tension headaches. MAKE SURE YOU:   Understand these instructions.  Will watch your  condition.  Will get help right away if you are not doing well or get worse. Document Released: 05/30/2004 Document Revised: 07/15/2011 Document Reviewed: 07/21/2010 [facility] Patient Information 2015 [place], [place]. This information is not intended to replace advice given to you by your health care provider. Make sure you discuss any questions you have with your health care provider.  Sinusitis Sinusitis is redness, soreness, and inflammation of the paranasal sinuses. Paranasal sinuses are air pockets within the bones of your face (beneath the eyes, the middle of the forehead, or above the eyes). In healthy paranasal sinuses, mucus is able to drain out, and air is able to circulate through them by way of your nose. However, when your paranasal sinuses are inflamed, mucus and air can become trapped. This can allow bacteria and other germs to grow and cause infection. Sinusitis can develop quickly and last only a short time (acute) or continue over a long period (chronic). Sinusitis that lasts for more than 12 weeks is considered chronic.  CAUSES  Causes of sinusitis include:  Allergies.  Structural abnormalities, such as displacement of the cartilage that separates your nostrils (deviated septum), which can decrease the air flow through your nose and sinuses and affect sinus drainage.  Functional abnormalities, such as when the small hairs (cilia) that line your sinuses and help remove mucus do not work properly or are not present. SIGNS AND SYMPTOMS  Symptoms of acute and chronic sinusitis are the same. The primary symptoms are pain and pressure around the affected sinuses. Other symptoms include:  Upper toothache.  Earache.  Headache.  Bad breath.  Decreased sense of smell and taste.  A cough, which worsens when you are lying flat.  Fatigue.  Fever.  Thick drainage from your nose, which often is green and may contain pus (purulent).  Swelling and warmth over the affected  sinuses. DIAGNOSIS  Your health care provider will perform a physical exam. During the exam, your health care provider may:  Look in your nose for signs of abnormal growths in your nostrils (nasal polyps).  Tap over the affected sinus to check for signs of infection.  View the inside of your sinuses (endoscopy) using an imaging device that has a light attached (endoscope). If your health care provider suspects that you have chronic sinusitis, one or more of the following tests may be recommended:  Allergy tests.  Nasal culture. A sample of mucus is taken from your nose, sent to a lab, and screened for bacteria.  Nasal cytology. A sample of mucus is taken from your nose and examined by your health care provider to determine if your sinusitis is related to an allergy. TREATMENT  Most cases of acute sinusitis are related to a viral infection and will resolve on their own within 10 days. Sometimes medicines are prescribed to help relieve symptoms (pain medicine, decongestants, nasal steroid sprays, or saline sprays).  However, for sinusitis related to a bacterial infection, your health care provider will prescribe antibiotic medicines. These are medicines that will help kill the bacteria causing the infection.  Rarely, sinusitis is caused by a fungal infection. In theses cases, your health care provider will prescribe antifungal medicine. For some cases of chronic sinusitis, surgery is needed. Generally, these are cases in which sinusitis recurs more than 3 times per year, despite other treatments. HOME CARE INSTRUCTIONS   Drink plenty of water. Water helps thin the mucus so your sinuses can drain more easily.  Use a humidifier.  Inhale steam 3 to 4 times a day (for example, sit in the bathroom with the shower running).  Apply a warm, moist washcloth to your face 3 to 4 times a day, or as directed by your health care provider.  Use saline nasal sprays to help moisten and clean your  sinuses.  Take medicines only as directed by your health care provider.  If you were prescribed either an antibiotic or antifungal medicine, finish it all even if you start to feel better. SEEK IMMEDIATE MEDICAL CARE IF:  You have increasing pain or severe headaches.  You have nausea, vomiting, or drowsiness.  You have swelling around your face.  You have vision problems.  You have a stiff neck.  You have difficulty breathing. MAKE SURE YOU:   Understand these instructions.  Will watch your condition.  Will get help right away if you are not doing well or get worse. Document Released: 04/22/2005 Document Revised: 09/06/2013 Document Reviewed: 05/07/2011 [facility] Patient Information 2015 [place], [place]. This information is not intended to replace advice given to you by your health care provider. Make sure you discuss any questions you have with your health care provider.

## 2014-09-20 ENCOUNTER — Encounter: Payer: Self-pay | Admitting: Family Medicine

## 2014-09-20 ENCOUNTER — Ambulatory Visit: Payer: 59 | Attending: Family Medicine | Admitting: Family Medicine

## 2014-09-20 VITALS — BP 116/74 | HR 77 | Temp 98.9°F | Resp 16 | Ht 74.0 in | Wt 153.0 lb

## 2014-09-20 DIAGNOSIS — Z87891 Personal history of nicotine dependence: Secondary | ICD-10-CM | POA: Insufficient documentation

## 2014-09-20 DIAGNOSIS — F329 Major depressive disorder, single episode, unspecified: Secondary | ICD-10-CM

## 2014-09-20 DIAGNOSIS — G478 Other sleep disorders: Secondary | ICD-10-CM | POA: Insufficient documentation

## 2014-09-20 DIAGNOSIS — F32A Depression, unspecified: Secondary | ICD-10-CM

## 2014-09-20 DIAGNOSIS — E559 Vitamin D deficiency, unspecified: Secondary | ICD-10-CM | POA: Diagnosis not present

## 2014-09-20 DIAGNOSIS — F5105 Insomnia due to other mental disorder: Secondary | ICD-10-CM | POA: Diagnosis not present

## 2014-09-20 DIAGNOSIS — R51 Headache: Secondary | ICD-10-CM | POA: Diagnosis present

## 2014-09-20 DIAGNOSIS — G44221 Chronic tension-type headache, intractable: Secondary | ICD-10-CM | POA: Diagnosis not present

## 2014-09-20 DIAGNOSIS — G44209 Tension-type headache, unspecified, not intractable: Secondary | ICD-10-CM | POA: Insufficient documentation

## 2014-09-20 MED ORDER — VITAMIN D (ERGOCALCIFEROL) 1.25 MG (50000 UNIT) PO CAPS: 50000.0000 [IU] | ORAL_CAPSULE | ORAL | Status: DC

## 2014-09-20 MED ORDER — TRAZODONE HCL 50 MG PO TABS: 50.0000 mg | ORAL_TABLET | Freq: Every day | ORAL | Status: DC

## 2014-09-20 MED ORDER — CYCLOBENZAPRINE HCL 10 MG PO TABS: 10.0000 mg | ORAL_TABLET | Freq: Two times a day (BID) | ORAL | Status: DC | PRN

## 2014-09-20 NOTE — Patient Instructions (Signed)
Mr. Jesse Long,  Thank you for coming in today  1. Tension-type headache Flexeril-muscle relaxer as needed Trazodone for sleep  Ophthalmology referral   2. Vit D def: Vit D weekly for 8 weeks   F/u in 6 weeks for mood and headache    Dr. Armen PickupFunches

## 2014-09-20 NOTE — Progress Notes (Signed)
   Subjective:    Patient ID: Jesse Long, male    DOB: 03/02/1981, 34 y.o.   MRN: [id number] CC: f/u HA,  HPI  1. Headache: temple headache. Pressure and tightness L>R. No fever, chills or head trauma. Treated for recently sinusitis. No rhinorrhea.   2. Vit D Def: has not yet taking vit D.   3. Poor sleep; associated with untreated mood disorder. Not taking lexapro due to worsening HA. Not taking atarax as it did not help with panic attacks. Has not sought counseling services.   Soc Hx: former smoker quit  Review of Systems  Neurological: Positive for headaches.  Psychiatric/Behavioral: Positive for sleep disturbance. The patient is nervous/anxious.        Objective:   Physical Exam BP 116/74 mmHg  Pulse 77  Temp(Src) 98.9 F (37.2 C) (Oral)  Resp 16  Ht 6\' 2"  (1.88 m)  Wt 153 lb (69.4 kg)  BMI 19.64 kg/m2  SpO2 99% General appearance: alert, cooperative and no distress Head: Normocephalic, without obvious abnormality, atraumatic Eyes: conjunctivae/corneas clear. PERRL, EOM's intact.  Ears: normal TM's and external ear canals both ears Nose: no discharge, turbinates pink, swollen Throat: lips, mucosa, and tongue normal; teeth and gums normal Neck: no adenopathy, supple, symmetrical, trachea midline and thyroid not enlarged, symmetric, no tenderness/mass/nodules       Assessment & Plan:

## 2014-09-20 NOTE — Assessment & Plan Note (Signed)
A; untreated with insomnia  P: trazodone started

## 2014-09-20 NOTE — Assessment & Plan Note (Signed)
Vit D def: Vit D weekly for 8 weeks

## 2014-09-20 NOTE — Assessment & Plan Note (Signed)
Tension-type headache Flexeril-muscle relaxer as needed Trazodone for sleep  Ophthalmology referral

## 2014-09-20 NOTE — Progress Notes (Signed)
ED F/U HA x 2 month and pain still there  F/U medication reaction Rx Lexapro and Atarax  Atarax, do not help Lexapro,mood changes and disoriented

## 2014-10-04 ENCOUNTER — Other Ambulatory Visit: Payer: Self-pay | Admitting: Gastroenterology

## 2014-10-04 DIAGNOSIS — R1084 Generalized abdominal pain: Secondary | ICD-10-CM

## 2014-10-13 ENCOUNTER — Encounter (HOSPITAL_COMMUNITY): Payer: 59 | Attending: Gastroenterology

## 2014-11-24 ENCOUNTER — Emergency Department (INDEPENDENT_AMBULATORY_CARE_PROVIDER_SITE_OTHER)
Admission: EM | Admit: 2014-11-24 | Discharge: 2014-11-24 | Disposition: A | Discharge status: Home / Self Care | Payer: 59 | Source: Home / Self Care | Attending: Family Medicine | Admitting: Family Medicine

## 2014-11-24 ENCOUNTER — Encounter (HOSPITAL_COMMUNITY): Payer: Self-pay | Admitting: Emergency Medicine

## 2014-11-24 DIAGNOSIS — J301 Allergic rhinitis due to pollen: Secondary | ICD-10-CM

## 2014-11-24 DIAGNOSIS — R0982 Postnasal drip: Secondary | ICD-10-CM

## 2014-11-24 DIAGNOSIS — J4530 Mild persistent asthma, uncomplicated: Secondary | ICD-10-CM

## 2014-11-24 DIAGNOSIS — R05 Cough: Secondary | ICD-10-CM | POA: Diagnosis not present

## 2014-11-24 DIAGNOSIS — R059 Cough, unspecified: Secondary | ICD-10-CM

## 2014-11-24 DIAGNOSIS — J9801 Acute bronchospasm: Secondary | ICD-10-CM

## 2014-11-24 MED ORDER — PREDNISONE 20 MG PO TABS: ORAL_TABLET | ORAL | Status: DC

## 2014-11-24 MED ORDER — ALBUTEROL SULFATE HFA 108 (90 BASE) MCG/ACT IN AERS: 2.0000 | INHALATION_SPRAY | RESPIRATORY_TRACT | Status: DC | PRN

## 2014-11-24 NOTE — ED Provider Notes (Signed)
CSN: [id number]     Arrival date & time 11/24/14  1535 History   First MD Initiated Contact with Patient 11/24/14 1545     Chief Complaint  Patient presents with  . Cough   (Consider location/radiation/quality/duration/timing/severity/associated sxs/prior Treatment) HPI Comments: 34 year old male who is complaining of a cough for 2 weeks and left ear pressure for 2 days. He was treated with antibiotic's and decongest  approximally 3-4 weeks ago for headaches believed to be sinus issues. He underwent CT scans of the sinuses as well as an upper endoscopy. He denies hearing anything about having reflux. Denies fever, PND or shortness of breath.   Past Medical History  Diagnosis Date  . No pertinent past medical history   . Polysubstance abuse 05/2013    cocaine tried the night before first panic attack, weed in the past   . Anxiety 2000    anxiety with anger issues. since HS.   . Depression 2000    since HS. no SI. just nothing makes you happy   . Panic attack 2016  . Palpitations    Past Surgical History  Procedure Laterality Date  . I&d extremity  03/08/2012    Procedure: IRRIGATION AND DEBRIDEMENT EXTREMITY;  Surgeon: Velna Ochs, MD;  Location: [facility] OR;  Service: Orthopedics;  Laterality: Left;   Family History  Problem Relation Age of Onset  . Adopted: Yes   History  Substance Use Topics  . Smoking status: Former Smoker -- 0.25 packs/day    Types: Cigarettes    Quit date: 08/11/2013  . Smokeless tobacco: Never Used  . Alcohol Use: 0.0 oz/week    0 Standard drinks or equivalent per week    Review of Systems  Constitutional: Negative.   HENT: Positive for congestion. Negative for postnasal drip, rhinorrhea and sore throat.        Left ear discomfort and fullness  Eyes: Negative.   Respiratory: Positive for cough. Negative for choking, shortness of breath, wheezing and stridor.   Cardiovascular: Negative for chest pain.  Gastrointestinal: Negative.   Neurological:  Negative.     Allergies  Review of patient's allergies indicates no known allergies.  Home Medications   Prior to Admission medications   Medication Sig Start Date End Date Taking? Authorizing Provider  acetaminophen (TYLENOL) 325 MG tablet Take 650 mg by mouth every 6 (six) hours as needed for headache.    Historical Provider, MD  albuterol (PROVENTIL HFA;VENTOLIN HFA) 108 (90 BASE) MCG/ACT inhaler Inhale 2 puffs into the lungs every 4 (four) hours as needed for wheezing or shortness of breath. 11/24/14   Hayden Rasmussen, NP  cyclobenzaprine (FLEXERIL) 10 MG tablet Take 1 tablet (10 mg total) by mouth 3 times/day as needed-between meals & bedtime for muscle spasms (tension headache). 09/20/14   Josalyn Funches, MD  fluticasone (FLONASE) 50 MCG/ACT nasal spray Place 2 sprays into both nostrils daily. 09/10/14   Marisa Severin, MD  naproxen (NAPROSYN) 500 MG tablet Take 1 tablet (500 mg total) by mouth 2 (two) times daily with a meal. 09/10/14   Marisa Severin, MD  predniSONE (DELTASONE) 20 MG tablet 3 Tabs PO Days 1-3, then 2 tabs PO Days 4-6, then 1 tab PO Day 7-9, then Half Tab PO Day 10-12 11/24/14   Hayden Rasmussen, NP  traZODone (DESYREL) 50 MG tablet Take 1 tablet (50 mg total) by mouth at bedtime. 09/20/14   Josalyn Funches, MD  Vitamin D, Ergocalciferol, (DRISDOL) 50000 UNITS CAPS capsule Take 1 capsule (50,000 Units total)  by mouth every 7 (seven) days. For 8 weeks 09/20/14   Josalyn Funches, MD   BP 115/67 mmHg  Pulse 88  Temp(Src) 98.1 F (36.7 C) (Oral)  Resp 20  SpO2 97% Physical Exam  Constitutional: He is oriented to person, place, and time. He appears well-developed and well-nourished. No distress.  HENT:  Head: Normocephalic and atraumatic.  Mouth/Throat: No oropharyngeal exudate.  Right TM with mild retraction. Left TM with mild bulging and slight erythema. No effusion noted. Oropharynx with frothy clear PND and cobblestoning.  Eyes: Conjunctivae and EOM are normal.  Neck: Normal range of  motion. Neck supple.  Cardiovascular: Normal rate, regular rhythm and normal heart sounds.   Pulmonary/Chest: Effort normal and breath sounds normal. No respiratory distress. He has no wheezes.  No wheezes heard with deep inspiration however with forced cough wheezes are produced.  Musculoskeletal: Normal range of motion. He exhibits no edema.  Lymphadenopathy:    He has no cervical adenopathy.  Neurological: He is alert and oriented to person, place, and time.  Skin: Skin is warm and dry. No rash noted.  Psychiatric: He has a normal mood and affect.  Nursing note and vitals reviewed.   ED Course  Procedures (including critical care time) Labs Review Labs Reviewed - No data to display  Imaging Review No results found.   MDM   1. PND (post-nasal drip)   2. Allergic rhinitis due to pollen   3. Cough   4. Bronchospasm, acute   5. RAD (reactive airway disease) with wheezing, mild persistent, uncomplicated    Recommend taken daily Allegra 180 mg/day or 60 mg twice a day, or Claritin 10 mg or Zyrtec 10 mg. Continue the Flonase as directed Sudafed PE 10 mg q 4h prn congeston Use copious amounts of nasal saline Prednisone taper as directed for cough and bronchospasm. Albuterol HFA 2 puffs q4h prn cough.  Talk to your gastroenterologist or PCP about the possibility of reflux as this is a potential cause for chronic cough.     Hayden Rasmussen, NP 11/24/14 1621  Hayden Rasmussen, NP 11/24/14 1622

## 2014-11-24 NOTE — ED Notes (Signed)
Pt states that he has had a bad cough for 2 weeks with left ear fullness

## 2014-11-24 NOTE — Discharge Instructions (Signed)
Allergic Rhinitis Recommend taken daily Allegra 180 mg/day or 60 mg twice a day, or Claritin 10 mg or Zyrtec 10 mg. Continue the Flonase as directed Use copious amounts of nasal saline Prednisone taper as directed for cough and bronchospasm. For congestion, especially ears stopping up try Sudafed PE 10 mg  Every 4 hours.  Allergic rhinitis is when the mucous membranes in the nose respond to allergens. Allergens are particles in the air that cause your body to have an allergic reaction. This causes you to release allergic antibodies. Through a chain of events, these eventually cause you to release histamine into the blood stream. Although meant to protect the body, it is this release of histamine that causes your discomfort, such as frequent sneezing, congestion, and an itchy, runny nose.  CAUSES  Seasonal allergic rhinitis (hay fever) is caused by pollen allergens that may come from grasses, trees, and weeds. Year-round allergic rhinitis (perennial allergic rhinitis) is caused by allergens such as house dust mites, pet dander, and mold spores.  SYMPTOMS  1. Nasal stuffiness (congestion). 2. Itchy, runny nose with sneezing and tearing of the eyes. DIAGNOSIS  Your health care provider can help you determine the allergen or allergens that trigger your symptoms. If you and your health care provider are unable to determine the allergen, skin or blood testing may be used. TREATMENT  Allergic rhinitis does not have a cure, but it can be controlled by:  Medicines and allergy shots (immunotherapy).  Avoiding the allergen. Hay fever may often be treated with antihistamines in pill or nasal spray forms. Antihistamines block the effects of histamine. There are over-the-counter medicines that may help with nasal congestion and swelling around the eyes. Check with your health care provider before taking or giving this medicine.  If avoiding the allergen or the medicine prescribed do not work, there are many  new medicines your health care provider can prescribe. Stronger medicine may be used if initial measures are ineffective. Desensitizing injections can be used if medicine and avoidance does not work. Desensitization is when a patient is given ongoing shots until the body becomes less sensitive to the allergen. Make sure you follow up with your health care provider if problems continue. HOME CARE INSTRUCTIONS It is not possible to completely avoid allergens, but you can reduce your symptoms by taking steps to limit your exposure to them. It helps to know exactly what you are allergic to so that you can avoid your specific triggers. SEEK MEDICAL CARE IF:   You have a fever.  You develop a cough that does not stop easily (persistent).  You have shortness of breath.  You start wheezing.  Symptoms interfere with normal daily activities. Document Released: 01/15/2001 Document Revised: 04/27/2013 Document Reviewed: 12/28/2012 [facility] Patient Information 2015 [place], [place]. This information is not intended to replace advice given to you by your health care provider. Make sure you discuss any questions you have with your health care provider.  Bronchospasm A bronchospasm is when the tubes that carry air in and out of your lungs (airways) spasm or tighten. During a bronchospasm it is hard to breathe. This is because the airways get smaller. A bronchospasm can be triggered by: 3. Allergies. These may be to animals, pollen, food, or mold. 4. Infection. This is a common cause of bronchospasm. 5. Exercise. 6. Irritants. These include pollution, cigarette smoke, strong odors, aerosol sprays, and paint fumes. 7. Weather changes. 8. Stress. 9. Being emotional. HOME CARE   Always have a plan  for getting help. Know when to call your doctor and local emergency services (911 in the [place] Know where you can get emergency care.  Only take medicines as told by your doctor.  If you were prescribed an  inhaler or nebulizer machine, ask your doctor how to use it correctly. Always use a spacer with your inhaler if you were given one.  Stay calm during an attack. Try to relax and breathe more slowly.  Control your home environment:  Change your heating and air conditioning filter at least once a month.  Limit your use of fireplaces and wood stoves.  Do not  smoke. Do not  allow smoking in your home.  Avoid perfumes and fragrances.  Get rid of pests (such as roaches and mice) and their droppings.  Throw away plants if you see mold on them.  Keep your house clean and dust free.  Replace carpet with wood, tile, or vinyl flooring. Carpet can trap dander and dust.  Use allergy-proof pillows, mattress covers, and box spring covers.  Wash bed sheets and blankets every week in hot water. Dry them in a dryer.  Use blankets that are made of polyester or cotton.  Wash hands frequently. GET HELP IF:  You have muscle aches.  You have chest pain.  The thick spit you spit or cough up (sputum) changes from clear or white to yellow, green, gray, or bloody.  The thick spit you spit or cough up gets thicker.  There are problems that may be related to the medicine you are given such as:  A rash.  Itching.  Swelling.  Trouble breathing. GET HELP RIGHT AWAY IF:  You feel you cannot breathe or catch your breath.  You cannot stop coughing.  Your treatment is not helping you breathe better.  You have very bad chest pain. MAKE SURE YOU:   Understand these instructions.  Will watch your condition.  Will get help right away if you are not doing well or get worse. Document Released: 02/17/2009 Document Revised: 04/27/2013 Document Reviewed: 10/13/2012 [facility] Patient Information 2015 [place], [place]. This information is not intended to replace advice given to you by your health care provider. Make sure you discuss any questions you have with your health care provider.  Cough,  Adult Talk to your gastroenterologist or PCP about the possibility of reflux as this is a potential cause for chronic cough.  A cough is a reflex that helps clear your throat and airways. It can help heal the body or may be a reaction to an irritated airway. A cough may only last 2 or 3 weeks (acute) or may last more than 8 weeks (chronic).  CAUSES Acute cough: 10. Viral or bacterial infections. Chronic cough:  Infections.  Allergies.  Asthma.  Post-nasal drip.  Smoking.  Heartburn or acid reflux.  Some medicines.  Chronic lung problems (COPD).  Cancer. SYMPTOMS   Cough.  Fever.  Chest pain.  Increased breathing rate.  High-pitched whistling sound when breathing (wheezing).  Colored mucus that you cough up (sputum). TREATMENT   A bacterial cough may be treated with antibiotic medicine.  A viral cough must run its course and will not respond to antibiotics.  Your caregiver may recommend other treatments if you have a chronic cough. HOME CARE INSTRUCTIONS   Only take over-the-counter or prescription medicines for pain, discomfort, or fever as directed by your caregiver. Use cough suppressants only as directed by your caregiver.  Use a cold steam vaporizer or humidifier in  your bedroom or home to help loosen secretions.  Sleep in a semi-upright position if your cough is worse at night.  Rest as needed.  Stop smoking if you smoke. SEEK IMMEDIATE MEDICAL CARE IF:   You have pus in your sputum.  Your cough starts to worsen.  You cannot control your cough with suppressants and are losing sleep.  You begin coughing up blood.  You have difficulty breathing.  You develop pain which is getting worse or is uncontrolled with medicine.  You have a fever. MAKE SURE YOU:   Understand these instructions.  Will watch your condition.  Will get help right away if you are not doing well or get worse. Document Released: 10/19/2010 Document Revised: 07/15/2011  Document Reviewed: 10/19/2010 [facility] Patient Information 2015 [place], [place]. This information is not intended to replace advice given to you by your health care provider. Make sure you discuss any questions you have with your health care provider.  How to Use an Inhaler Using your inhaler correctly is very important. Good technique will make sure that the medicine reaches your lungs.  HOW TO USE AN INHALER: 11. Take the cap off the inhaler. 12. If this is the first time using your inhaler, you need to prime it. Shake the inhaler for 5 seconds. Release four puffs into the air, away from your face. Ask your doctor for help if you have questions. 13. Shake the inhaler for 5 seconds. 14. Turn the inhaler so the bottle is above the mouthpiece. 15. Put your pointer finger on top of the bottle. Your thumb holds the bottom of the inhaler. 16. Open your mouth. 17. Either hold the inhaler away from your mouth (the width of 2 fingers) or place your lips tightly around the mouthpiece. Ask your doctor which way to use your inhaler. 18. Breathe out as much air as possible. 19. Breathe in and push down on the bottle 1 time to release the medicine. You will feel the medicine go in your mouth and throat. 20. Continue to take a deep breath in very slowly. Try to fill your lungs. 21. After you have breathed in completely, hold your breath for 10 seconds. This will help the medicine to settle in your lungs. If you cannot hold your breath for 10 seconds, hold it for as long as you can before you breathe out. 22. Breathe out slowly, through pursed lips. Whistling is an example of pursed lips. 23. If your doctor has told you to take more than 1 puff, wait at least 15-30 seconds between puffs. This will help you get the best results from your medicine. Do not use the inhaler more than your doctor tells you to. 24. Put the cap back on the inhaler. 25. Follow the directions from your doctor or from the inhaler package  about cleaning the inhaler. If you use more than one inhaler, ask your doctor which inhalers to use and what order to use them in. Ask your doctor to help you figure out when you will need to refill your inhaler.  If you use a steroid inhaler, always rinse your mouth with water after your last puff, gargle and spit out the water. Do not swallow the water. GET HELP IF:  The inhaler medicine only partially helps to stop wheezing or shortness of breath.  You are having trouble using your inhaler.  You have some increase in thick spit (phlegm). GET HELP RIGHT AWAY IF:  The inhaler medicine does not help your wheezing  or shortness of breath or you have tightness in your chest.  You have dizziness, headaches, or fast heart rate.  You have chills, fever, or night sweats.  You have a large increase of thick spit, or your thick spit is bloody. MAKE SURE YOU:   Understand these instructions.  Will watch your condition.  Will get help right away if you are not doing well or get worse. Document Released: 01/30/2008 Document Revised: 02/10/2013 Document Reviewed: 11/19/2012 [facility] Patient Information 2015 [place], [place]. This information is not intended to replace advice given to you by your health care provider. Make sure you discuss any questions you have with your health care provider.

## 2014-12-21 ENCOUNTER — Encounter: Payer: Self-pay | Admitting: Family Medicine

## 2014-12-21 ENCOUNTER — Ambulatory Visit: Payer: 59 | Attending: Family Medicine | Admitting: Family Medicine

## 2014-12-21 VITALS — BP 101/57 | HR 66 | Temp 98.6°F | Resp 16 | Ht 74.0 in | Wt 154.0 lb

## 2014-12-21 DIAGNOSIS — G44221 Chronic tension-type headache, intractable: Secondary | ICD-10-CM

## 2014-12-21 DIAGNOSIS — E559 Vitamin D deficiency, unspecified: Secondary | ICD-10-CM | POA: Diagnosis not present

## 2014-12-21 DIAGNOSIS — J310 Chronic rhinitis: Secondary | ICD-10-CM | POA: Insufficient documentation

## 2014-12-21 DIAGNOSIS — J309 Allergic rhinitis, unspecified: Secondary | ICD-10-CM

## 2014-12-21 DIAGNOSIS — Z23 Encounter for immunization: Secondary | ICD-10-CM | POA: Diagnosis not present

## 2014-12-21 DIAGNOSIS — Z87891 Personal history of nicotine dependence: Secondary | ICD-10-CM | POA: Diagnosis not present

## 2014-12-21 MED ORDER — VERAPAMIL HCL ER 240 MG PO CP24: 240.0000 mg | ORAL_CAPSULE | Freq: Every day | ORAL | Status: DC

## 2014-12-21 NOTE — Assessment & Plan Note (Signed)
Vit D deficiency: Rechecking level  3. Chronic L sided headache, daily: Verapamil extended release 240 mg at night for headache prevention

## 2014-12-21 NOTE — Progress Notes (Signed)
F/U persistent headache on lt side. No changes since last visit  Complaining of coughing, Was at [facility] about two weeks ago and was Rx Inhaler and prednisone. Stated still with dry cough.

## 2014-12-21 NOTE — Progress Notes (Signed)
   Subjective:    Patient ID: Jesse Long, male    DOB: October 06, 1980, 34 y.o.   MRN: [id number] CC: L sided HA and cough  HPI 34 yo F with anxiety presents for f/u   1. Headache: L sided. Persistent. For past 4 months.  Starts a temple radiates to L side of head. Daily. Mild to moderate pain. No vision changes, nausea, emesis, hearing loss. Has been seen in ED for HA. Normal CT head except for sinusitis. He has been treated for sinusitis.   2. Cough: intermittent dry cough throughout the day. Smoking e-cigarette. Took prednisone, albuterol with slight improvement. Not using flonase. No CP or SOB>   Social History  Substance Use Topics  . Smoking status: Former Smoker -- 0.25 packs/day    Types: Cigarettes    Quit date: 08/11/2013  . Smokeless tobacco: Never Used  . Alcohol Use: 0.0 oz/week    0 Standard drinks or equivalent per week    Review of Systems  Constitutional: Negative for fever, chills, fatigue and unexpected weight change.  Eyes: Negative for visual disturbance.  Respiratory: Positive for cough. Negative for shortness of breath.   Cardiovascular: Negative for chest pain, palpitations and leg swelling.  Gastrointestinal: Negative for nausea, vomiting, abdominal pain, diarrhea, constipation and blood in stool.  Endocrine: Negative for polydipsia, polyphagia and polyuria.  Musculoskeletal: Negative for myalgias, back pain, arthralgias, gait problem and neck pain.  Skin: Negative for rash.  Allergic/Immunologic: Negative for immunocompromised state.  Neurological: Positive for headaches.  Hematological: Negative for adenopathy. Does not bruise/bleed easily.  Psychiatric/Behavioral: Negative for suicidal ideas, sleep disturbance and dysphoric mood. The patient is not nervous/anxious.       Objective:   Physical Exam  Constitutional: He appears well-developed and well-nourished. No distress.  HENT:  Head: Normocephalic.  Right Ear: Tympanic membrane, external ear and ear  canal normal.  Left Ear: External ear and ear canal normal. Tympanic membrane is injected.  Nose: Mucosal edema present. Right sinus exhibits no maxillary sinus tenderness and no frontal sinus tenderness. Left sinus exhibits no maxillary sinus tenderness and no frontal sinus tenderness.  Mouth/Throat: Uvula is midline, oropharynx is clear and moist and mucous membranes are normal.  Eyes: Conjunctivae and EOM are normal. Pupils are equal, round, and reactive to light.  Neck: Normal range of motion. Neck supple.  Cardiovascular: Normal rate, regular rhythm, normal heart sounds and intact distal pulses.   Pulmonary/Chest: Effort normal and breath sounds normal.  Musculoskeletal: He exhibits no edema.  Neurological: He is alert.  Skin: Skin is warm and dry. No rash noted. No erythema.  Psychiatric: He has a normal mood and affect.  BP 101/57 mmHg  Pulse 66  Temp(Src) 98.6 F (37 C) (Oral)  Resp 16  Ht  (1.88 m)  Wt 154 lb (69.854 kg)  BMI 19.76 kg/m2  SpO2 100%  Wt Readings from Last 3 Encounters:  12/21/14 154 lb (69.854 kg)  09/20/14 153 lb (69.4 kg)  09/09/14 153 lb 9.6 oz (69.673 kg)           Assessment & Plan:

## 2014-12-21 NOTE — Assessment & Plan Note (Signed)
Swollen nasal turbinates with L ear redness concerning for sinus pressure: Continue nightly flonase

## 2014-12-21 NOTE — Assessment & Plan Note (Signed)
Chronic L sided headache, daily: Verapamil extended release 240 mg at night for headache prevention

## 2014-12-21 NOTE — Patient Instructions (Addendum)
Jesse Long,  Thank you for coming in today.  1. Swollen nasal turbinates with L ear redness concerning for sinus pressure: Continue nightly flonase   2. Vit D deficiency: Rechecking level  3. Chronic L sided headache, daily: Verapamil extended release 240 mg at night for headache prevention   F/u with RN in 2 weeks for BP check  F/u with me in 4 weeks for chronic headache   Dr. Armen Pickup

## 2014-12-22 LAB — VITAMIN D 25 HYDROXY (VIT D DEFICIENCY, FRACTURES): VIT D 25 HYDROXY: 21 ng/mL — AB (ref 30–100)

## 2014-12-26 MED ORDER — VITAMIN D3 50 MCG (2000 UT) PO TABS: 2000.0000 [IU] | ORAL_TABLET | Freq: Every day | ORAL | Status: DC

## 2014-12-26 NOTE — Addendum Note (Signed)
Addended by: Dessa Phi on: 12/26/2014 01:40 PM   Modules accepted: Orders

## 2014-12-29 ENCOUNTER — Ambulatory Visit (HOSPITAL_COMMUNITY)
Admission: RE | Admit: 2014-12-29 | Discharge: 2014-12-29 | Disposition: A | Discharge status: Home / Self Care | Payer: 59 | Source: Ambulatory Visit | Attending: Gastroenterology | Admitting: Gastroenterology

## 2014-12-29 DIAGNOSIS — R1084 Generalized abdominal pain: Secondary | ICD-10-CM | POA: Diagnosis not present

## 2014-12-29 MED ORDER — TECHNETIUM TC 99M SULFUR COLLOID
2.0000 | Freq: Once | INTRAVENOUS | Status: DC | PRN
  Administered 2014-12-29: 2 via ORAL
  Filled 2014-12-29: qty 2

## 2016-09-20 ENCOUNTER — Emergency Department (HOSPITAL_COMMUNITY): Payer: Self-pay

## 2016-09-20 ENCOUNTER — Emergency Department (HOSPITAL_COMMUNITY)
Admission: EM | Admit: 2016-09-20 | Discharge: 2016-09-20 | Disposition: A | Discharge status: Home / Self Care | Payer: Self-pay | Attending: Emergency Medicine | Admitting: Emergency Medicine

## 2016-09-20 ENCOUNTER — Encounter (HOSPITAL_COMMUNITY): Payer: Self-pay | Admitting: *Deleted

## 2016-09-20 ENCOUNTER — Encounter (HOSPITAL_COMMUNITY): Payer: Self-pay

## 2016-09-20 ENCOUNTER — Ambulatory Visit (HOSPITAL_COMMUNITY)
Admission: EM | Admit: 2016-09-20 | Discharge: 2016-09-20 | Disposition: A | Discharge status: Home / Self Care | Payer: Self-pay | Attending: Internal Medicine | Admitting: Internal Medicine

## 2016-09-20 DIAGNOSIS — Z7982 Long term (current) use of aspirin: Secondary | ICD-10-CM | POA: Insufficient documentation

## 2016-09-20 DIAGNOSIS — R1031 Right lower quadrant pain: Secondary | ICD-10-CM | POA: Insufficient documentation

## 2016-09-20 DIAGNOSIS — Z87891 Personal history of nicotine dependence: Secondary | ICD-10-CM | POA: Insufficient documentation

## 2016-09-20 DIAGNOSIS — Z79899 Other long term (current) drug therapy: Secondary | ICD-10-CM | POA: Insufficient documentation

## 2016-09-20 LAB — COMPREHENSIVE METABOLIC PANEL
ALT: 13 U/L — AB (ref 17–63)
AST: 28 U/L (ref 15–41)
Albumin: 4.8 g/dL (ref 3.5–5.0)
Alkaline Phosphatase: 50 U/L (ref 38–126)
Anion gap: 8 (ref 5–15)
BUN: 8 mg/dL (ref 6–20)
CO2: 29 mmol/L (ref 22–32)
CREATININE: 0.87 mg/dL (ref 0.61–1.24)
Calcium: 9.4 mg/dL (ref 8.9–10.3)
Chloride: 101 mmol/L (ref 101–111)
Glucose, Bld: 84 mg/dL (ref 65–99)
POTASSIUM: 4.2 mmol/L (ref 3.5–5.1)
Sodium: 138 mmol/L (ref 135–145)
TOTAL PROTEIN: 7.3 g/dL (ref 6.5–8.1)
Total Bilirubin: 1.3 mg/dL — ABNORMAL HIGH (ref 0.3–1.2)

## 2016-09-20 LAB — CBC
HCT: 40.1 % (ref 39.0–52.0)
Hemoglobin: 13.8 g/dL (ref 13.0–17.0)
MCH: 30.4 pg (ref 26.0–34.0)
MCHC: 34.4 g/dL (ref 30.0–36.0)
MCV: 88.3 fL (ref 78.0–100.0)
PLATELETS: 270 10*3/uL (ref 150–400)
RBC: 4.54 MIL/uL (ref 4.22–5.81)
RDW: 12 % (ref 11.5–15.5)
WBC: 5.4 10*3/uL (ref 4.0–10.5)

## 2016-09-20 LAB — LIPASE, BLOOD: Lipase: 19 U/L (ref 11–51)

## 2016-09-20 MED ORDER — IOPAMIDOL (ISOVUE-300) INJECTION 61%
100.0000 mL | Freq: Once | INTRAVENOUS | Status: AC | PRN
  Administered 2016-09-20: 100 mL via INTRAVENOUS

## 2016-09-20 MED ORDER — POLYETHYLENE GLYCOL 3350 17 G PO PACK: 17.0000 g | PACK | Freq: Every day | ORAL | 0 refills | Status: AC

## 2016-09-20 MED ORDER — IOPAMIDOL (ISOVUE-300) INJECTION 61%
INTRAVENOUS | Status: AC
  Filled 2016-09-20: qty 100

## 2016-09-20 MED ORDER — DICYCLOMINE HCL 20 MG PO TABS: 20.0000 mg | ORAL_TABLET | Freq: Two times a day (BID) | ORAL | 0 refills | Status: DC

## 2016-09-20 MED ORDER — SODIUM CHLORIDE 0.9 % IV BOLUS (SEPSIS)
1000.0000 mL | Freq: Once | INTRAVENOUS | Status: AC
  Administered 2016-09-20: 1000 mL via INTRAVENOUS

## 2016-09-20 MED ORDER — ONDANSETRON 4 MG PO TBDP: 4.0000 mg | ORAL_TABLET | Freq: Three times a day (TID) | ORAL | 0 refills | Status: DC | PRN

## 2016-09-20 MED ORDER — SENNOSIDES-DOCUSATE SODIUM 8.6-50 MG PO TABS: 1.0000 | ORAL_TABLET | Freq: Every day | ORAL | 0 refills | Status: AC

## 2016-09-20 NOTE — ED Provider Notes (Signed)
CSN: [id number]     Arrival date & time 09/20/16  1447 History   First MD Initiated Contact with Patient 09/20/16 1537     Chief Complaint  Patient presents with  . Abdominal Pain   (Consider location/radiation/quality/duration/timing/severity/associated sxs/prior Treatment) Subjective:   Jesse Long is a 36 y.o. male who presents for evaluation of abdominal pain. The pain is described as sharp and is 7/10 in intensity. Pain is located in the RLQ with radiation to right back and right flank area. Onset was 3 days ago. Symptoms have been unchanged since. Aggravating factors: activity.  Alleviating factors: none. Associated symptoms: chills and nausea. The patient denies constipation, diarrhea, fever, sweats, vomiting, dysuria, penile pain, testicular discomfort, penile discharge, anorexia or difficulty tolerating PO.   The following portions of the patient's history were reviewed and updated as appropriate: allergies, current medications, past family history, past medical history, past social history, past surgical history and problem list.         Past Medical History:  Diagnosis Date  . Anxiety 2000   anxiety with anger issues. since HS.   . Depression 2000   since HS. no SI. just nothing makes you happy   . No pertinent past medical history   . Palpitations   . Panic attack 2016  . Polysubstance abuse 05/2013   cocaine tried the night before first panic attack, weed in the past    Past Surgical History:  Procedure Laterality Date  . I&D EXTREMITY  03/08/2012   Procedure: IRRIGATION AND DEBRIDEMENT EXTREMITY;  Surgeon: Velna Ochs, MD;  Location: [facility] OR;  Service: Orthopedics;  Laterality: Left;   Family History  Problem Relation Age of Onset  . Adopted: Yes   Social History  Substance Use Topics  . Smoking status: Former Smoker    Packs/day: 0.25    Types: Cigarettes    Quit date: 08/11/2013  . Smokeless tobacco: Never Used  . Alcohol use 0.0 oz/week    Review  of Systems  Constitutional: Positive for chills. Negative for diaphoresis and fever.  Gastrointestinal: Positive for abdominal pain and nausea. Negative for constipation, diarrhea and vomiting.  All other systems reviewed and are negative.   Allergies  Patient has no known allergies.  Home Medications   Prior to Admission medications   Medication Sig Start Date End Date Taking? Authorizing Provider  acetaminophen (TYLENOL) 325 MG tablet Take 650 mg by mouth every 6 (six) hours as needed for headache.   Yes [provider]  naproxen (NAPROSYN) 500 MG tablet Take 1 tablet (500 mg total) by mouth 2 (two) times daily with a meal. 09/10/14  Yes Marisa Severin, MD  Cholecalciferol (VITAMIN D3) 2000 UNITS TABS Take 2,000 Units by mouth daily. 12/26/14   Funches, Gerilyn Nestle, MD  fluticasone (FLONASE) 50 MCG/ACT nasal spray Place 2 sprays into both nostrils daily. Patient not taking: Reported on 12/21/2014 09/10/14   Marisa Severin, MD  verapamil (VERELAN PM) 240 MG 24 hr capsule Take 1 capsule (240 mg total) by mouth at bedtime. 12/21/14   Dessa Phi, MD   Meds Ordered and Administered this Visit  Medications - No data to display  BP 118/74 (BP Location: Right Arm)   Pulse 91   Temp 98.7 F (37.1 C) (Oral)   Resp 20   SpO2 100%  No data found.   Physical Exam  Constitutional: He is oriented to person, place, and time. He appears well-developed and well-nourished. No distress.  Neck: Normal range of motion.  Cardiovascular: Normal rate and regular rhythm.   Pulmonary/Chest: Effort normal and breath sounds normal.  Abdominal: Soft. Bowel sounds are normal. He exhibits no distension.  Right lower quadrant tenderness with voluntary guarding but no rebound.  Musculoskeletal: Normal range of motion.  Neurological: He is alert and oriented to person, place, and time.  Skin: Skin is warm and dry.  Psychiatric: He has a normal mood and affect.    Urgent Care Course     Procedures  (including critical care time)  Labs Review Labs Reviewed - No data to display  Imaging Review No results found.   Visual Acuity Review  Right Eye Distance:   Left Eye Distance:   Bilateral Distance:    Right Eye Near:   Left Eye Near:    Bilateral Near:         MDM   1. Right lower quadrant abdominal pain    Patient presents with acute abdominal pain. Differential diagnoses include biliary colic, acute cholecystitis, acute appendicitis, diverticulitis or kidney stones. We do not have the ability to perform imaging such as CT scans at this facility. Patient will be referred to the ED for further evaluation.   Discussed possible diagnosis and need for further evaluation with patient. All questions have been answered and all concerns have been addressed. The patient verbalized understanding and had no further questions. He will go by private vehicle for further evaluation.     Lurline IdolMurrill, Somaly Marteney, FNP 09/20/16 1611    Lurline IdolMurrill, Mong Neal, [place]NP 09/20/16 [phone]

## 2016-09-20 NOTE — ED Triage Notes (Signed)
Pt having abdominal pain for 3 days in his RLQ and said he thinks it's his appendix. States the pain radiates to his back. No fever. Hurts to the touch.

## 2016-09-20 NOTE — ED Provider Notes (Signed)
Emergency Department Provider Note   I have reviewed the triage vital signs and the nursing notes.   HISTORY  Chief Complaint Abdominal Pain   HPI Jesse Long is a 36 y.o. male with PMH of depression and anxiety presents to the emergency department for evaluation of right lower quadrant abdominal pain. Symptoms have been severe over the past 48 hours. He does note some right sided "swelling sensation" over the last 4 days but the right lower quadrant became acutely painful 2 days ago. Describes it as sharp, nonradiating, constant pain. No modifying factors. No fevers or chills. He has had some nausea. Patient takes Suboxone and feels that this may be limiting some of his pain symptoms. He went to urgent care and was referred to the emergency department for consideration of CT abdomen pelvis.   Past Medical History:  Diagnosis Date  . Anxiety 2000   anxiety with anger issues. since HS.   . Depression 2000   since HS. no SI. just nothing makes you happy   . No pertinent past medical history   . Palpitations   . Panic attack 2016  . Polysubstance abuse 05/2013   cocaine tried the night before first panic attack, weed in the past     Patient Active Problem List   Diagnosis Date Noted  . Rhinitis 12/21/2014  . Tension type headache 09/20/2014  . Vitamin D insufficiency 08/26/2014  . Constipation 08/24/2014  . Unintentional weight loss 08/24/2014  . Anxiety   . Depression     Past Surgical History:  Procedure Laterality Date  . I&D EXTREMITY  03/08/2012   Procedure: IRRIGATION AND DEBRIDEMENT EXTREMITY;  Surgeon: Velna Ochs, MD;  Location: [facility] OR;  Service: Orthopedics;  Laterality: Left;    Current Outpatient Rx  . Order #: [id number] Class: Historical Med  . Order #: [id number] Class: Historical Med  . Order #: [id number] Class: Historical Med  . Order #: [id number] Class: Normal  . Order #: [id number] Class: Print  . Order #: [id number] Class: Print  . Order #:  [id number] Class: Print  . Order #: [id number] Class: Print  . Order #: [id number] Class: Print  . Order #: [id number] Class: Print  . Order #: [id number] Class: Normal    Allergies Patient has no known allergies.  Family History  Problem Relation Age of Onset  . Adopted: Yes    Social History Social History  Substance Use Topics  . Smoking status: Former Smoker    Packs/day: 0.25    Types: Cigarettes    Quit date: 08/11/2013  . Smokeless tobacco: Never Used  . Alcohol use 0.0 oz/week    Review of Systems  Constitutional: No fever/chills Eyes: No visual changes. ENT: No sore throat. Cardiovascular: Denies chest pain. Respiratory: Denies shortness of breath. Gastrointestinal: Positive RLQ abdominal pain. Positive nausea, no vomiting.  No diarrhea.  No constipation. Genitourinary: Negative for dysuria. Musculoskeletal: Negative for back pain. Skin: Negative for rash. Neurological: Negative for headaches, focal weakness or numbness.  10-point ROS otherwise negative.  ____________________________________________   PHYSICAL EXAM:  VITAL SIGNS: ED Triage Vitals [09/20/16 1701]  Enc Vitals Group     BP 120/72     Pulse Rate 62     Temp 98.9 F (37.2 C)     Temp Source Oral     SpO2 100 %     Pain Score 2   Constitutional: Alert and oriented. Well appearing and in no acute distress. Eyes: Conjunctivae are normal. Head: Atraumatic. Nose: No congestion/rhinnorhea. Mouth/Throat: Mucous membranes are moist.  Oropharynx  non-erythematous. Neck: No stridor.   Cardiovascular: Normal rate, regular rhythm. Good peripheral circulation. Grossly normal heart sounds.   Respiratory: Normal respiratory effort.  No retractions. Lungs CTAB. Gastrointestinal: Soft with focal RLQ tenderness to palpation. No rebound but does have some voluntary guarding. No distention.  Musculoskeletal: No lower extremity tenderness nor edema. No gross deformities of extremities. Neurologic:  Normal speech  and language. No gross focal neurologic deficits are appreciated.  Skin:  Skin is warm, dry and intact. No rash noted.  ____________________________________________   LABS (all labs ordered are listed, but only abnormal results are displayed)  Labs Reviewed  COMPREHENSIVE METABOLIC PANEL - Abnormal; Notable for the following:       Result Value   ALT 13 (*)    Total Bilirubin 1.3 (*)    All other components within normal limits  LIPASE, BLOOD  CBC  URINALYSIS, ROUTINE W REFLEX MICROSCOPIC   ____________________________________________  RADIOLOGY  Ct Abdomen Pelvis W Contrast  Result Date: 09/20/2016 CLINICAL DATA:  Right lower quadrant pain for 3 days EXAM: CT ABDOMEN AND PELVIS WITH CONTRAST TECHNIQUE: Multidetector CT imaging of the abdomen and pelvis was performed using the standard protocol following bolus administration of intravenous contrast. CONTRAST:  ISOVUE-300 IOPAMIDOL (ISOVUE-300) INJECTION 61% COMPARISON:  06/09/2014 FINDINGS: Lower chest: Lung bases demonstrate no acute consolidation or pleural effusion. The heart size is within normal limits. Hepatobiliary: No focal liver abnormality is seen. No gallstones, gallbladder wall thickening, or biliary dilatation. Pancreas: Unremarkable. No pancreatic ductal dilatation or surrounding inflammatory changes. Spleen: Normal in size without focal abnormality. Adrenals/Urinary Tract: Adrenal glands are unremarkable. Kidneys are normal, without renal calculi, focal lesion, or hydronephrosis. Bladder is unremarkable. Stomach/Bowel: Stomach is within normal limits. Appendix appears normal. No evidence of bowel wall thickening, distention, or inflammatory changes. Vascular/Lymphatic: No significant vascular findings are present. No enlarged abdominal or pelvic lymph nodes. Reproductive: Prostate slightly enlarged and contains coarse calcification. Other: No free air.  Trace fluid in the pelvis. Musculoskeletal: No acute or significant  osseous findings. IMPRESSION: 1. Negative for acute appendicitis. No right lower quadrant inflammatory process is visualized. 2. Slightly enlarged prostate gland with calcification, consider correlation with PSA Electronically Signed   By: Jasmine Pang M.D.   On: 09/20/2016 20:51    ____________________________________________   PROCEDURES  Procedure(s) performed:   Procedures  None ____________________________________________   INITIAL IMPRESSION / ASSESSMENT AND PLAN / ED COURSE  Pertinent labs & imaging results that were available during my care of the patient were reviewed by me and considered in my medical decision making (see chart for details).  Patient resents to the emergency department for evaluation of focal right lower quadrant pain for the past 2 days. He does have focal tenderness to palpation with some voluntary guarding. Patient also with history of constipation. No GU symptoms. Patient is well-appearing and does not 1 pain or nausea medication at this time. He is on Suboxone at home. Plan for CT scan of the abdomen and pelvis given his exam and history to rule out acute appendicitis.   Differential diagnosis includes but is not exclusive to acute appendicitis, renal colic, testicular torsion, urinary tract infection, prostatitis,  diverticulitis, small bowel obstruction, colitis, abdominal aortic aneurysm, gastroenteritis, constipation etc.  08:59 PM CT negative for acute appendicitis. Plan for symptom management at home with increased laxative use. I discussed the findings and the prostate and recommended outpatient PSA testing. Patient will discuss with PCP.   At this time, I do not feel there  is any life-threatening condition present. I have reviewed and discussed all results (EKG, imaging, lab, urine as appropriate), exam findings with patient. I have reviewed nursing notes and appropriate previous records.  I feel the patient is safe to be discharged home without  further emergent workup. Discussed usual and customary return precautions. Patient and family (if present) verbalize understanding and are comfortable with this plan.  Patient will follow-up with their primary care provider. If they do not have a primary care provider, information for follow-up has been provided to them. All questions have been answered.  ____________________________________________  FINAL CLINICAL IMPRESSION(S) / ED DIAGNOSES  Final diagnoses:  Right lower quadrant abdominal pain     MEDICATIONS GIVEN DURING THIS VISIT:  Medications  sodium chloride 0.9 % bolus 1,000 mL (1,000 mLs Intravenous New Bag/Given 09/20/16 1917)  iopamidol (ISOVUE-300) 61 % injection 100 mL (100 mLs Intravenous Contrast Given 09/20/16 2017)     NEW OUTPATIENT MEDICATIONS STARTED DURING THIS VISIT:  New Prescriptions   DICYCLOMINE (BENTYL) 20 MG TABLET    Take 1 tablet (20 mg total) by mouth 2 (two) times daily.   ONDANSETRON (ZOFRAN ODT) 4 MG DISINTEGRATING TABLET    Take 1 tablet (4 mg total) by mouth every 8 (eight) hours as needed for nausea or vomiting.   POLYETHYLENE GLYCOL (MIRALAX) PACKET    Take 17 g by mouth daily.   SENNA-DOCUSATE (SENOKOT-S) 8.6-50 MG TABLET    Take 1 tablet by mouth daily.      Note:  This document was prepared using Dragon voice recognition software and may include unintentional dictation errors.  Alona BeneJoshua Long, MD Emergency Medicine   Long, Arlyss RepressJoshua G, MD 09/20/16 2103

## 2016-09-20 NOTE — ED Notes (Signed)
Pt to CT at this time.

## 2016-09-20 NOTE — ED Triage Notes (Signed)
[facility] right lower abd pain. Started 3 days ago. Pain worse when standing. Nausea. No vomiting.

## 2016-09-20 NOTE — ED Notes (Signed)
Pt st's she has had RLQ pain x's 2 days.  Pt has nausea without vomiting.  Sent to ED from Urgent Care

## 2016-09-20 NOTE — Discharge Instructions (Signed)
You have been seen in the Emergency Department (ED) for abdominal pain.  Your evaluation did not identify a clear cause of your symptoms but was generally reassuring.  I would like to increase your laxative use to try and resolve some constipation. There was also some calcification in the prostate that is slightly abnormal. You should contact your PCP to discuss "PSA" lab testing for further evaluation.   Please follow up as instructed above regarding today?s emergent visit and the symptoms that are bothering you.  Return to the ED if your abdominal pain worsens or fails to improve, you develop bloody vomiting, bloody diarrhea, you are unable to tolerate fluids due to vomiting, fever greater than 101, or other symptoms that concern you.

## 2016-11-29 ENCOUNTER — Emergency Department (HOSPITAL_COMMUNITY)
Admission: EM | Admit: 2016-11-29 | Discharge: 2016-11-29 | Disposition: A | Discharge status: Home / Self Care | Payer: Self-pay | Attending: Emergency Medicine | Admitting: Emergency Medicine

## 2016-11-29 ENCOUNTER — Encounter (HOSPITAL_COMMUNITY): Payer: Self-pay | Admitting: *Deleted

## 2016-11-29 DIAGNOSIS — M5432 Sciatica, left side: Secondary | ICD-10-CM | POA: Insufficient documentation

## 2016-11-29 DIAGNOSIS — Z79899 Other long term (current) drug therapy: Secondary | ICD-10-CM | POA: Insufficient documentation

## 2016-11-29 DIAGNOSIS — G8929 Other chronic pain: Secondary | ICD-10-CM | POA: Insufficient documentation

## 2016-11-29 DIAGNOSIS — Z87891 Personal history of nicotine dependence: Secondary | ICD-10-CM | POA: Insufficient documentation

## 2016-11-29 DIAGNOSIS — Z7982 Long term (current) use of aspirin: Secondary | ICD-10-CM | POA: Insufficient documentation

## 2016-11-29 DIAGNOSIS — M545 Low back pain, unspecified: Secondary | ICD-10-CM

## 2016-11-29 MED ORDER — KETOROLAC TROMETHAMINE 15 MG/ML IJ SOLN
15.0000 mg | Freq: Once | INTRAMUSCULAR | Status: AC
  Administered 2016-11-29: 15 mg via INTRAMUSCULAR
  Filled 2016-11-29: qty 1

## 2016-11-29 MED ORDER — DEXAMETHASONE SODIUM PHOSPHATE 10 MG/ML IJ SOLN
10.0000 mg | Freq: Once | INTRAMUSCULAR | Status: AC
  Administered 2016-11-29: 10 mg via INTRAMUSCULAR
  Filled 2016-11-29: qty 1

## 2016-11-29 MED ORDER — TRAMADOL HCL 50 MG PO TABS: 50.0000 mg | ORAL_TABLET | Freq: Four times a day (QID) | ORAL | 0 refills | Status: DC | PRN

## 2016-11-29 MED ORDER — METHOCARBAMOL 500 MG PO TABS: 500.0000 mg | ORAL_TABLET | Freq: Every evening | ORAL | 0 refills | Status: DC | PRN

## 2016-11-29 NOTE — ED Provider Notes (Signed)
[facility]-EMERGENCY DEPT Provider Note   CSN: [id number] Arrival date & time: 11/29/16  4098   By signing my name below, I, Clarisse Gouge, attest that this documentation has been prepared under the direction and in the presence of Terance Hart, PA-C. Electronically signed, Clarisse Gouge, ED Scribe. 11/29/16. 10:05 AM.   History   Chief Complaint Chief Complaint  Patient presents with  . Back Pain   The history is provided by the patient and medical records. No language interpreter was used.    Jesse Long is a 36 y.o. male with h/o reported scoliosis, anxiety and panic attacks presenting to the Emergency Department concerning longstanding low back pain that has worsened x 2 weeks. He describes 8/10, constant aching in the R lower back and hip that is worse with palpation and also L hip pain that radiates to the L buttock down the LLE to the toes. Associated tingling to the R sided toes, redness to both calves and swelling to the L calf. Pt states he has taken ibuprofen with no relief to his back pain. He also notes an episode of palpations and nausea this morning that subsided shortly after it began. No h/o similar symptoms noted. No recent travel or hormone therapies. No trauma or injury. No fever or any other complaints noted at this time.   Past Medical History:  Diagnosis Date  . Anxiety 2000   anxiety with anger issues. since HS.   . Depression 2000   since HS. no SI. just nothing makes you happy   . No pertinent past medical history   . Palpitations   . Panic attack 2016  . Polysubstance abuse 05/2013   cocaine tried the night before first panic attack, weed in the past     Patient Active Problem List   Diagnosis Date Noted  . Rhinitis 12/21/2014  . Tension type headache 09/20/2014  . Vitamin D insufficiency 08/26/2014  . Constipation 08/24/2014  . Unintentional weight loss 08/24/2014  . Anxiety   . Depression     Past Surgical History:  Procedure Laterality Date  .  I&D EXTREMITY  03/08/2012   Procedure: IRRIGATION AND DEBRIDEMENT EXTREMITY;  Surgeon: Velna Ochs, MD;  Location: [facility] OR;  Service: Orthopedics;  Laterality: Left;       Home Medications    Prior to Admission medications   Medication Sig Start Date End Date Taking? Authorizing Provider  acetaminophen (TYLENOL) 325 MG tablet Take 650 mg by mouth every 6 (six) hours as needed for headache.    [provider]  aspirin 325 MG tablet Take 325 mg by mouth every 6 (six) hours as needed for moderate pain.    [provider]  Cholecalciferol (VITAMIN D3) 2000 UNITS TABS Take 2,000 Units by mouth daily. Patient not taking: Reported on 09/20/2016 12/26/14   Dessa Phi, MD  dicyclomine (BENTYL) 20 MG tablet Take 1 tablet (20 mg total) by mouth 2 (two) times daily. 09/20/16   Long, Arlyss Repress, MD  fluticasone (FLONASE) 50 MCG/ACT nasal spray Place 2 sprays into both nostrils daily. Patient not taking: Reported on 12/21/2014 09/10/14   Marisa Severin, MD  ibuprofen (ADVIL,MOTRIN) 200 MG tablet Take 600 mg by mouth every 6 (six) hours as needed for mild pain.    [provider]  naproxen (NAPROSYN) 500 MG tablet Take 1 tablet (500 mg total) by mouth 2 (two) times daily with a meal. Patient not taking: Reported on 09/20/2016 09/10/14   Marisa Severin, MD  ondansetron [facility]  ODT) 4 MG disintegrating tablet Take 1 tablet (4 mg total) by mouth every 8 (eight) hours as needed for nausea or vomiting. 09/20/16   Long, Arlyss RepressJoshua G, MD  verapamil (VERELAN PM) 240 MG 24 hr capsule Take 1 capsule (240 mg total) by mouth at bedtime. Patient not taking: Reported on 09/20/2016 12/21/14   Dessa PhiFunches, Josalyn, MD    Family History Family History  Problem Relation Age of Onset  . Adopted: Yes    Social History Social History  Substance Use Topics  . Smoking status: Former Smoker    Packs/day: 0.25    Types: Cigarettes    Quit date: 08/11/2013  . Smokeless tobacco: Never Used  . Alcohol use 0.0  oz/week     Allergies   Patient has no known allergies.   Review of Systems Review of Systems  Constitutional: Negative for activity change, appetite change, diaphoresis and fever.  Cardiovascular: Positive for palpitations and leg swelling.  Gastrointestinal: Positive for nausea. Negative for vomiting.  Musculoskeletal: Positive for back pain and myalgias. Negative for gait problem.  Skin: Positive for color change. Negative for wound.  Neurological: Negative for weakness and numbness.  All other systems reviewed and are negative.    Physical Exam Updated Vital Signs BP 140/89 (BP Location: Right Arm)   Pulse 72   Temp 98 F (36.7 C) (Oral)   Resp 14   Ht 6\' 2"  (1.88 m)   Wt 160 lb (72.6 kg)   SpO2 97%   BMI 20.54 kg/m   Physical Exam  Constitutional: He is oriented to person, place, and time. He appears well-developed and well-nourished. No distress.  HENT:  Head: Normocephalic and atraumatic.  Eyes: Pupils are equal, round, and reactive to light. Conjunctivae and EOM are normal. Right eye exhibits no discharge. Left eye exhibits no discharge. No scleral icterus.  Neck: Normal range of motion.  Cardiovascular: Normal rate.   Pulmonary/Chest: Effort normal. No respiratory distress.  Abdominal: He exhibits no distension.  Musculoskeletal: Normal range of motion. He exhibits tenderness.  Back: Inspection: No masses, deformity, or rash Palpation: No midline spinal tenderness. No paraspinal muscle tenderness. Tenderness of right lower back. Strength: 5/5 in lower extremities and normal plantar and dorsiflexion Sensation: Intact sensation with light touch in lower extremities bilaterally Reflexes: Patellar reflex is 2+ bilaterally SLR: Positive seated straight leg raise on L side Gait: Normal gait  Neurological: He is alert and oriented to person, place, and time.  Skin: Skin is warm and dry.  Patchy, red macular rash on bilateral ankles. No calf swelling or  tenderness.  Psychiatric: His behavior is normal. His mood appears anxious.  Nursing note and vitals reviewed.    ED Treatments / Results  DIAGNOSTIC STUDIES: Oxygen Saturation is 97% on RA, NL by my interpretation.    COORDINATION OF CARE: 10:01 AM-Discussed next steps with pt. Pt verbalized understanding and is agreeable with the plan. Will order/ Rx medication. Pt prepared for d/c, advised of symptomatic care at home, F/U instructions and return precautions.    Labs (all labs ordered are listed, but only abnormal results are displayed) Labs Reviewed - No data to display  EKG  EKG Interpretation None       Radiology No results found.  Procedures Procedures (including critical care time)  Medications Ordered in ED Medications  dexamethasone (DECADRON) injection 10 mg (10 mg Intramuscular Given 11/29/16 1015)  ketorolac (TORADOL) 15 MG/ML injection 15 mg (15 mg Intramuscular Given 11/29/16 1015)     Initial  Impression / Assessment and Plan / ED Course  I have reviewed the triage vital signs and the nursing notes.  Pertinent labs & imaging results that were available during my care of the patient were reviewed by me and considered in my medical decision making (see chart for details).  [age] year old male with chronic right sided back pain and L sided radicular pain. Vitals are normal. No red flags in history or on exam. Decadron and Toradol IM given in ED. Advised pain meds, muscle relaxers, supportive care and f/u with PCP.  Final Clinical Impressions(s) / ED Diagnoses   Final diagnoses:  Sciatica of left side  Chronic right-sided low back pain without sciatica    New Prescriptions Discharge Medication List as of 11/29/2016 10:05 AM    START taking these medications   Details  methocarbamol (ROBAXIN) 500 MG tablet Take 1 tablet (500 mg total) by mouth at bedtime and may repeat dose one time if needed., Starting Fri 11/29/2016, Print    traMADol (ULTRAM) 50 MG  tablet Take 1 tablet (50 mg total) by mouth every 6 (six) hours as needed., Starting Fri 11/29/2016, Print        I personally performed the services described in this documentation, which was scribed in my presence. The recorded information has been reviewed and is accurate.    Bethel BornGekas, Shivansh Hardaway Marie, PA-C 11/29/16 1623    Jacalyn LefevreHaviland, Julie, MD 12/01/16 [phone]

## 2016-11-29 NOTE — ED Triage Notes (Signed)
Pt c/o chronic R lower back pain with new onset L lower back pain x 3-4 days that radiates down the L leg, pt c/o L calf tightness, pt denies recent travel, denies SOB, denies injecting heroine on LLE, pt ambulatory, no redness or swelling noted to LLE upon assessment, A&O x4

## 2016-11-29 NOTE — Discharge Instructions (Signed)
Take Ibuprofen three times daily for the next week. Take this medicine with food. °Take muscle relaxer at bedtime to help you sleep. This medicine makes you drowsy so do not take before driving or work °Use a heating pad for sore muscles - use for 20 minutes several times a day °Return for worsening symptoms ° °

## 2017-02-02 IMAGING — DX DG ABDOMEN ACUTE W/ 1V CHEST
3 series · 3 of 3 positions shown · non-contrast
Comparison: CT 06/09/2014.

CLINICAL DATA: Abdominal pain and back pain for 3 days. Right-sided
chest pain and back pain.

EXAM:
DG ABDOMEN ACUTE W/ 1V CHEST

[chest pa]
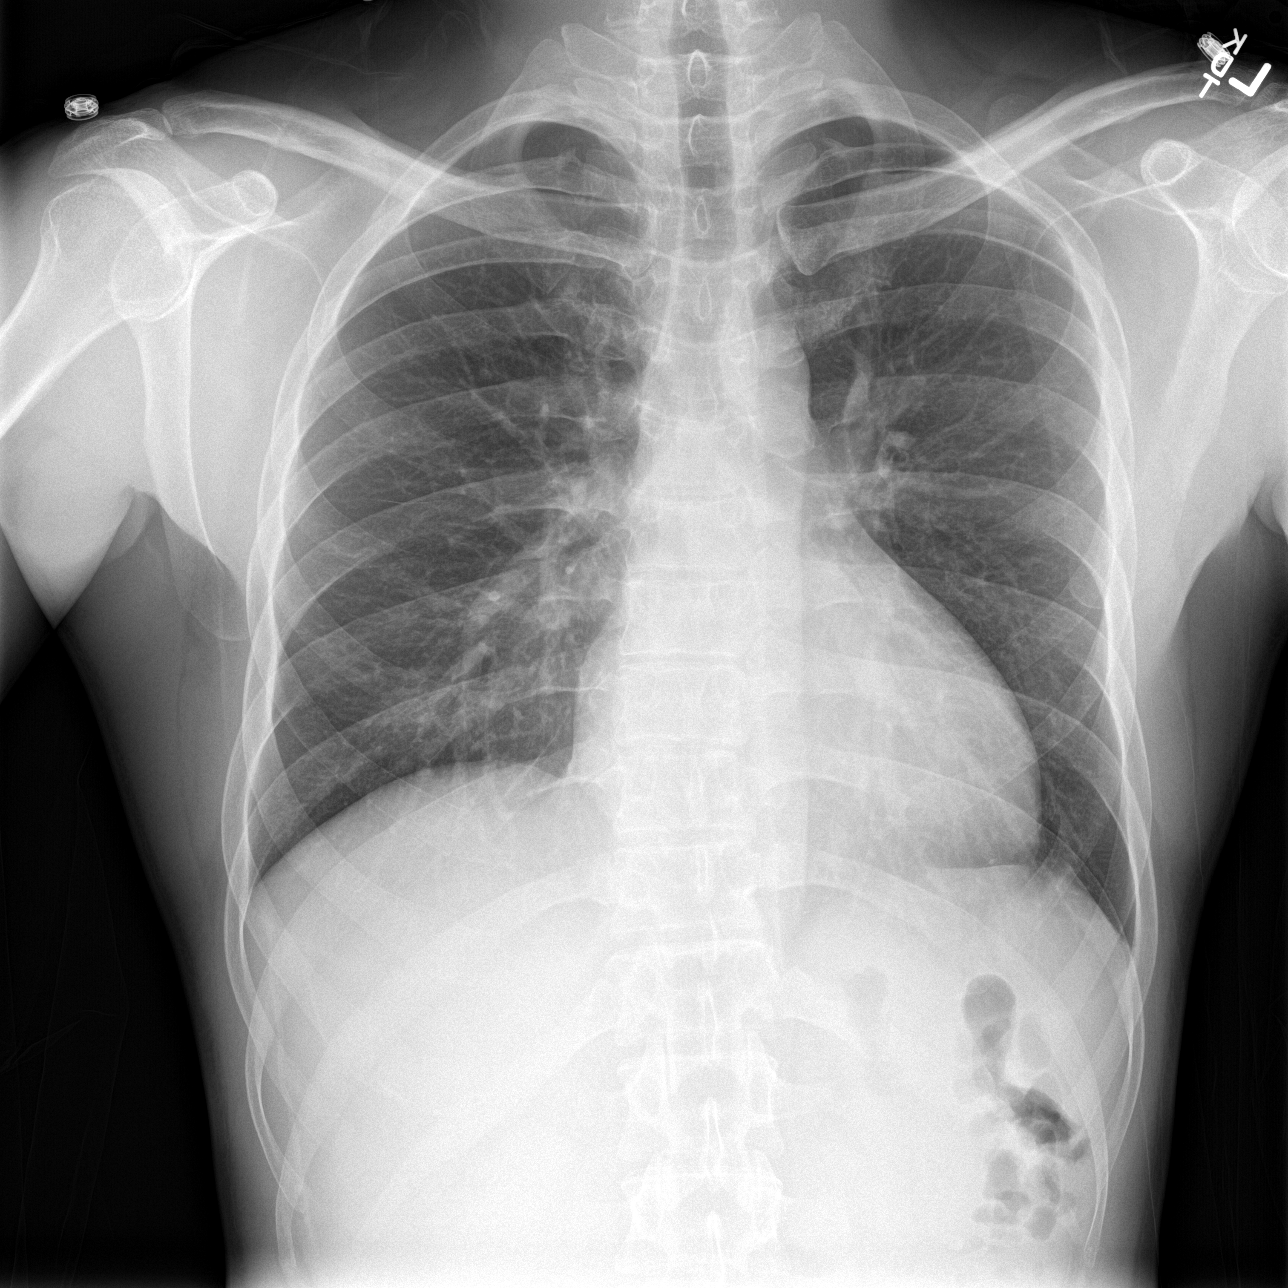

[abdomen supine]
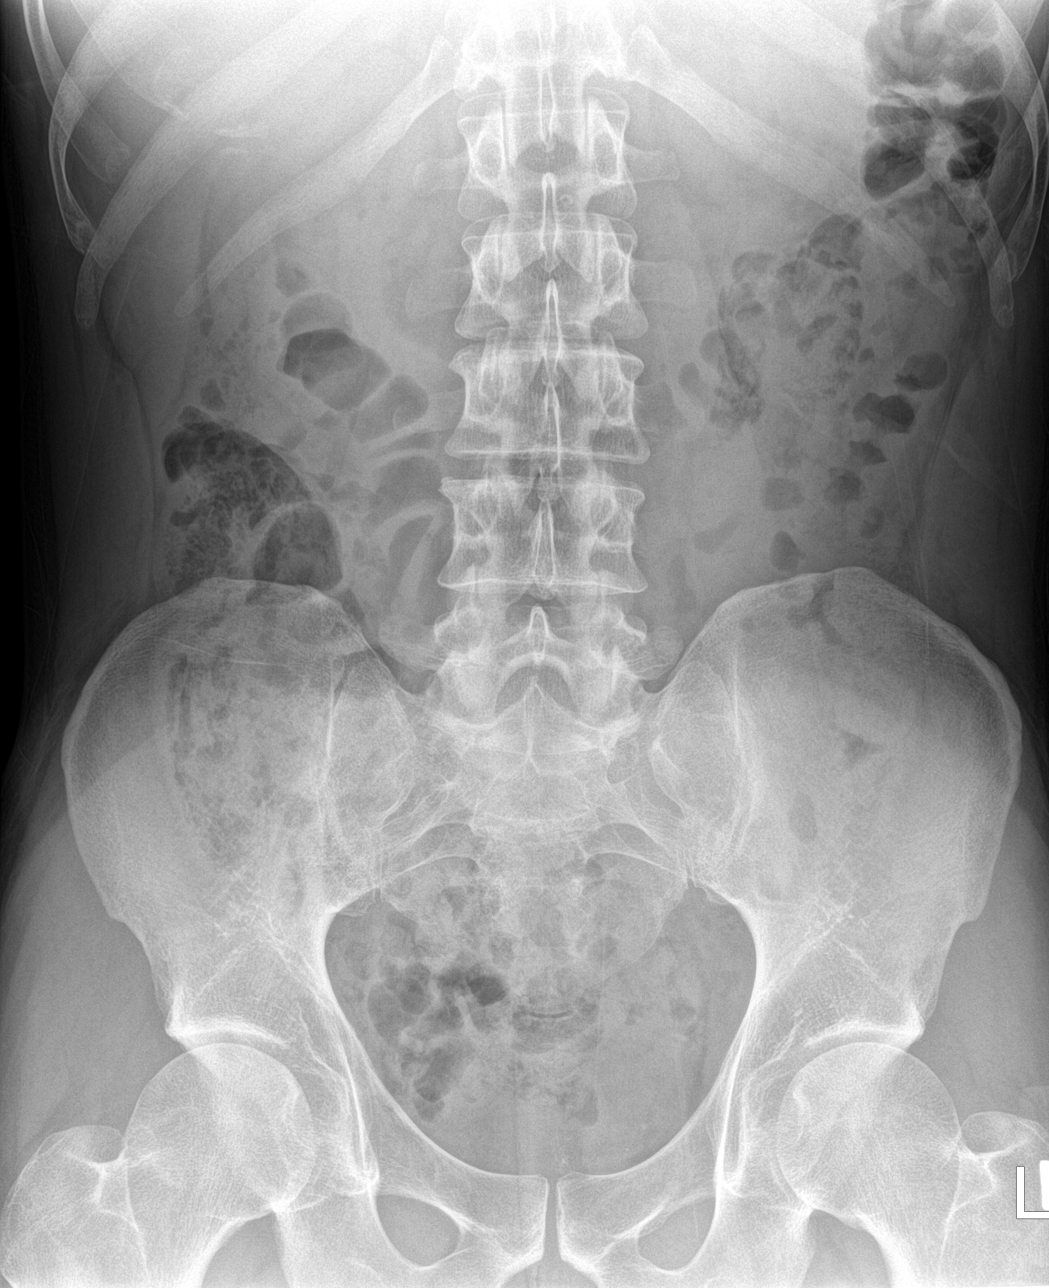

[abdomen erect]
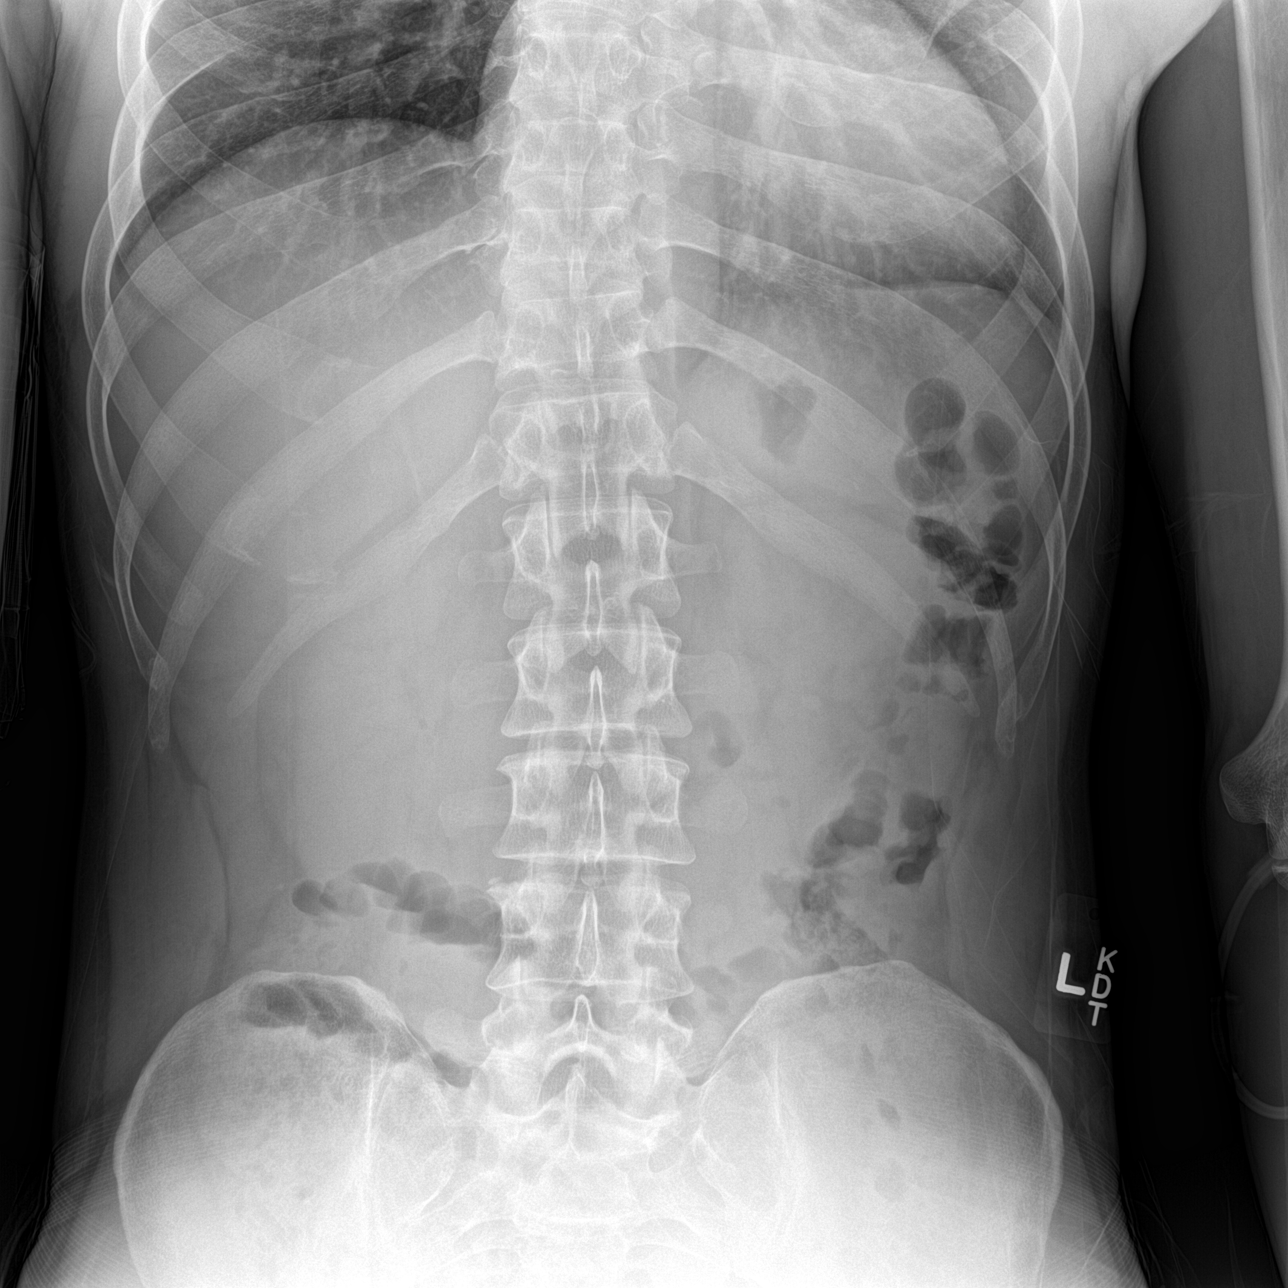

[3 of 3 positions shown; findings below may reference images not displayed]

FINDINGS: There is no evidence of dilated bowel loops or free intraperitoneal
air. No radiopaque calculi or other significant radiographic
abnormality is seen. Heart size and mediastinal contours are within
normal limits. Both lungs are clear.
IMPRESSION: Negative abdominal radiographs.  No acute cardiopulmonary disease.

## 2017-06-25 ENCOUNTER — Ambulatory Visit (HOSPITAL_COMMUNITY)
Admission: RE | Admit: 2017-06-25 | Discharge: 2017-06-25 | Disposition: A | Discharge status: Home / Self Care | Payer: BLUE CROSS/BLUE SHIELD | Source: Ambulatory Visit | Attending: Family Medicine | Admitting: Family Medicine

## 2017-06-25 ENCOUNTER — Encounter: Payer: Self-pay | Admitting: Family Medicine

## 2017-06-25 ENCOUNTER — Ambulatory Visit: Payer: BLUE CROSS/BLUE SHIELD | Admitting: Family Medicine

## 2017-06-25 VITALS — BP 131/79 | HR 65 | Temp 99.2°F | Resp 16 | Ht 74.0 in | Wt 177.0 lb

## 2017-06-25 DIAGNOSIS — R350 Frequency of micturition: Secondary | ICD-10-CM | POA: Diagnosis not present

## 2017-06-25 DIAGNOSIS — M5136 Other intervertebral disc degeneration, lumbar region: Secondary | ICD-10-CM | POA: Insufficient documentation

## 2017-06-25 DIAGNOSIS — N400 Enlarged prostate without lower urinary tract symptoms: Secondary | ICD-10-CM

## 2017-06-25 DIAGNOSIS — R5383 Other fatigue: Secondary | ICD-10-CM

## 2017-06-25 DIAGNOSIS — M4316 Spondylolisthesis, lumbar region: Secondary | ICD-10-CM | POA: Insufficient documentation

## 2017-06-25 DIAGNOSIS — E559 Vitamin D deficiency, unspecified: Secondary | ICD-10-CM

## 2017-06-25 DIAGNOSIS — M549 Dorsalgia, unspecified: Secondary | ICD-10-CM | POA: Diagnosis present

## 2017-06-25 DIAGNOSIS — Z13220 Encounter for screening for lipoid disorders: Secondary | ICD-10-CM | POA: Diagnosis not present

## 2017-06-25 DIAGNOSIS — Z131 Encounter for screening for diabetes mellitus: Secondary | ICD-10-CM

## 2017-06-25 DIAGNOSIS — M5430 Sciatica, unspecified side: Secondary | ICD-10-CM

## 2017-06-25 DIAGNOSIS — R7989 Other specified abnormal findings of blood chemistry: Secondary | ICD-10-CM | POA: Diagnosis not present

## 2017-06-25 DIAGNOSIS — N419 Inflammatory disease of prostate, unspecified: Secondary | ICD-10-CM | POA: Diagnosis not present

## 2017-06-25 LAB — POCT URINALYSIS DIP (DEVICE)
Bilirubin Urine: NEGATIVE
GLUCOSE, UA: NEGATIVE mg/dL
Hgb urine dipstick: NEGATIVE
Ketones, ur: NEGATIVE mg/dL
LEUKOCYTES UA: NEGATIVE
NITRITE: NEGATIVE
Protein, ur: NEGATIVE mg/dL
Specific Gravity, Urine: 1.025 (ref 1.005–1.030)
UROBILINOGEN UA: 0.2 mg/dL (ref 0.0–1.0)
pH: 5.5 (ref 5.0–8.0)

## 2017-06-25 LAB — POCT GLYCOSYLATED HEMOGLOBIN (HGB A1C): Hemoglobin A1C: 5

## 2017-06-25 MED ORDER — TAMSULOSIN HCL 0.4 MG PO CAPS
0.4000 mg | ORAL_CAPSULE | Freq: Every day | ORAL | 0 refills | Status: DC
Start: 2017-06-25 — End: 2017-08-14

## 2017-06-25 MED ORDER — GABAPENTIN 100 MG PO CAPS: 100.0000 mg | ORAL_CAPSULE | Freq: Three times a day (TID) | ORAL | 0 refills | Status: DC

## 2017-06-25 NOTE — Patient Instructions (Signed)
Thank you for establishing care.  For prostatitis, will start a trial of Tamsulosin 0.4 mg daily.  Will also start a trial of gabapentin 100 mg three times daily for low back pain with sciatica.   Will follow up by phone with any abnormal lab values.    Sciatica Sciatica is pain, numbness, weakness, or tingling along your sciatic nerve. The sciatic nerve starts in the lower back and goes down the back of each leg. Sciatica happens when this nerve is pinched or has pressure put on it. Sciatica usually goes away on its own or with treatment. Sometimes, sciatica may keep coming back (recur). Follow these instructions at home: Medicines  Take over-the-counter and prescription medicines only as told by your doctor.  Do not drive or use heavy machinery while taking prescription pain medicine. Managing pain  If directed, put ice on the affected area. ? Put ice in a plastic bag. ? Place a towel between your skin and the bag. ? Leave the ice on for 20 minutes, 2-3 times a day.  After icing, apply heat to the affected area before you exercise or as often as told by your doctor. Use the heat source that your doctor tells you to use, such as a moist heat pack or a heating pad. ? Place a towel between your skin and the heat source. ? Leave the heat on for 20-30 minutes. ? Remove the heat if your skin turns bright red. This is especially important if you are unable to feel pain, heat, or cold. You may have a greater risk of getting burned. Activity  Return to your normal activities as told by your doctor. Ask your doctor what activities are safe for you. ? Avoid activities that make your sciatica worse.  Take short rests during the day. Rest in a lying or standing position. This is usually better than sitting to rest. ? When you rest for a long time, do some physical activity or stretching between periods of rest. ? Avoid sitting for a long time without moving. Get up and move around at least one  time each hour.  Exercise and stretch regularly, as told by your doctor.  Do not lift anything that is heavier than 10 lb (4.5 kg) while you have symptoms of sciatica. ? Avoid lifting heavy things even when you do not have symptoms. ? Avoid lifting heavy things over and over.  When you lift objects, always lift in a way that is safe for your body. To do this, you should: ? Bend your knees. ? Keep the object close to your body. ? Avoid twisting. General instructions  Use good posture. ? Avoid leaning forward when you are sitting. ? Avoid hunching over when you are standing.  Stay at a healthy weight.  Wear comfortable shoes that support your feet. Avoid wearing high heels.  Avoid sleeping on a mattress that is too soft or too hard. You might have less pain if you sleep on a mattress that is firm enough to support your back.  Keep all follow-up visits as told by your doctor. This is important. Contact a doctor if:  You have pain that: ? Wakes you up when you are sleeping. ? Gets worse when you lie down. ? Is worse than the pain you have had in the past. ? Lasts longer than 4 weeks.  You lose weight for without trying. Get help right away if:  You cannot control when you pee (urinate) or poop (have a bowel  movement).  You have weakness in any of these areas and it gets worse. ? Lower back. ? Lower belly (pelvis). ? Butt (buttocks). ? Legs.  You have redness or swelling of your back.  You have a burning feeling when you pee. This information is not intended to replace advice given to you by your health care provider. Make sure you discuss any questions you have with your health care provider. Document Released: 01/30/2008 Document Revised: 09/28/2015 Document Reviewed: 12/30/2014 Elsevier Interactive Patient Education  2018 [employer].  Prostatitis Prostatitis is swelling of the prostate gland. The prostate helps to make semen. It is below a man's bladder, in front  of the rectum. There are different types of prostatitis. Follow these instructions at home:  Take over-the-counter and prescription medicines only as told by your doctor.  If you were prescribed an antibiotic medicine, take it as told by your doctor. Do not stop taking the antibiotic even if you start to feel better.  If your doctor prescribed exercises, do them as directed.  Take sitz baths as told by your doctor. To take a sitz bath, sit in warm water that is deep enough to cover your hips and butt.  Keep all follow-up visits as told by your doctor. This is important. Contact a doctor if:  Your symptoms get worse.  You have a fever. Get help right away if:  You have chills.  You feel sick to your stomach (nauseous).  You throw up (vomit).  You feel light-headed.  You feel like you might pass out (faint).  You cannot pee (urinate).  You have blood or clumps of blood (blood clots) in your pee (urine). This information is not intended to replace advice given to you by your health care provider. Make sure you discuss any questions you have with your health care provider. Document Released: 10/22/2011 Document Revised: 01/11/2016 Document Reviewed: 01/11/2016 Elsevier Interactive Patient Education  2017 [employer].

## 2017-06-25 NOTE — Progress Notes (Signed)
Chief Complaint  Patient presents with  . Establish Care  . Elevated PSA  . Leg Pain    left     Subjective:    Patient ID: Jesse Long, male    DOB: 01/20/1981, 37 y.o.   MRN: [id number]  HPI 37 year old male with a history of low back pain with sciatica and vitamin D insufficiency presents to establish care.  Patient has been without a primary provider for greater than 2 years due to insurance constraints.  Jesse Long is complaining of right back pain with sciatica to left leg.  Patient states that pain has been present for greater than 4 months.  He says that he found a stationary ball and was exercising consistently.  Reports that pain started shortly after using stationary ball.  Patient typically takes OTC analgesics.  Exacerbating factors identifiable by patient are bending backwards, bending forwards, bending sideways, sitting, standing and walking uphill.  Patient has attempted over-the-counter analgesics without sustained relief.  He last had Tylenol 650 mg around 9 PM last night.   Patient is also complaining of urinary frequency. He underwent a CT of the abdomen and pelvis, which showed an enlarged prostate. Patient endorses frequent. He denies incontinence of urine and stool, abdominal pain, dysuria, or hematuria. He is adopted and is unsure whether he has a family history of prostate cancer.  Past Medical History:  Diagnosis Date  . Anxiety 2000   anxiety with anger issues. since HS.   . Depression 2000   since HS. no SI. just nothing makes you happy   . No pertinent past medical history   . Palpitations   . Panic attack 2016  . Polysubstance abuse (HCC) 05/2013   cocaine tried the night before first panic attack, weed in the past    Social History   Socioeconomic History  . Marital status: Single    Spouse name: Not on file  . Number of children: 0  . Years of education: college   . Highest education level: Not on file  Social Needs  . Financial resource strain:  Not on file  . Food insecurity - worry: Not on file  . Food insecurity - inability: Not on file  . Transportation needs - medical: Not on file  . Transportation needs - non-medical: Not on file  Occupational History  . Occupation: Chef     Comment: [employer]   Tobacco Use  . Smoking status: Former Smoker    Packs/day: 0.25    Types: Cigarettes    Last attempt to quit: 08/11/2013    Years since quitting: 3.8  . Smokeless tobacco: Never Used  Substance and Sexual Activity  . Alcohol use: No    Alcohol/week: 0.0 oz    Frequency: Never  . Drug use: Yes    Types: Oxycodone  . Sexual activity: Not Currently    Partners: Female    Comment: last drug used Jan 1,2016  Other Topics Concern  . Not on file  Social History Narrative   Lives with brother (not a blood relative).    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 Tdap 12/21/2014    Review of Systems  HENT: Negative.   Eyes: Negative for visual disturbance.  Respiratory: Negative.   Cardiovascular: Negative.   Gastrointestinal: Negative.   Endocrine: Positive for polyuria. Negative for polyphagia.  Genitourinary: Positive for frequency.  Musculoskeletal: Negative.   Allergic/Immunologic: Negative for immunocompromised state.  Neurological:  Negative.   Hematological: Negative.   Psychiatric/Behavioral: Negative.        Objective:   Physical Exam  Constitutional: He is oriented to person, place, and time.  HENT:  Head: Normocephalic and atraumatic.  Right Ear: External ear normal.  Left Ear: External ear normal.  Nose: Nose normal.  Mouth/Throat: Oropharynx is clear and moist.  Eyes: Pupils are equal, round, and reactive to light.  Neck: Normal range of motion. Neck supple.  Cardiovascular: Normal rate, regular rhythm, normal heart sounds and intact distal pulses.  Pulmonary/Chest: Effort normal and breath sounds normal.  Abdominal: Soft. Bowel sounds are normal.   Genitourinary: Rectum normal. Prostate is enlarged.  Musculoskeletal: Normal range of motion.  Neurological: He is alert and oriented to person, place, and time. He has normal reflexes.  Skin: Skin is warm and dry.  Psychiatric: He has a normal mood and affect. His behavior is normal. Judgment and thought content normal.      BP 131/79 (BP Location: Left Arm, Patient Position: Sitting, Cuff Size: Normal)   Pulse 65   Temp 99.2 F (37.3 C) (Oral)   Resp 16   Ht 6\' 2"  (1.88 m)   Wt 177 lb (80.3 kg)   SpO2 100%   BMI 22.73 kg/m  Assessment & Plan:  1. Vitamin D insufficiency - Vitamin D, 25-hydroxy  2. Back pain with sciatica - DG Lumbar Spine Complete; Future - gabapentin (NEURONTIN) 100 MG capsule; Take 1 capsule (100 mg total) by mouth 3 (three) times daily.  Dispense: 90 capsule; Refill: 0 - POCT urinalysis dip (device)  3. Urinary frequency - PSA - POCT urinalysis dip (device)  4. Diabetes mellitus screening - HgB A1c  5. Screening cholesterol level - Lipid Panel  6. Other fatigue - CBC with Differential - Basic Metabolic Panel - TSH  7. Enlarged prostate - PSA  8. Prostatitis IMPRESSION: 1. Negative for acute appendicitis. No right lower quadrant inflammatory process is visualized. 2. Slightly enlarged prostate gland with calcification, consider correlation with PSA Will start a trial of tamsulosin 0.4 mg daily. Will follow up in 4 weeks.   tamsulosin (FLOMAX) 0.4 MG CAPS capsule; Take 1 capsule (0.4 mg total) by mouth daily.  Dispense: 30 capsule; Refill: 0   RTC: 1 month for back pain with sciatica an prostatitis   Nolon Nations  MSN, FNP-C Patient Care [facility] [address] [place], [place] [address] [phone]

## 2017-06-26 LAB — CBC WITH DIFFERENTIAL/PLATELET
BASOS ABS: 0 10*3/uL (ref 0.0–0.2)
Basos: 0 %
EOS (ABSOLUTE): 0.1 10*3/uL (ref 0.0–0.4)
Eos: 1 %
Hematocrit: 42.2 % (ref 37.5–51.0)
Hemoglobin: 14.3 g/dL (ref 13.0–17.7)
Immature Grans (Abs): 0 10*3/uL (ref 0.0–0.1)
Immature Granulocytes: 0 %
LYMPHS ABS: 1.6 10*3/uL (ref 0.7–3.1)
LYMPHS: 35 %
MCH: 30.4 pg (ref 26.6–33.0)
MCHC: 33.9 g/dL (ref 31.5–35.7)
MCV: 90 fL (ref 79–97)
Monocytes Absolute: 0.6 10*3/uL (ref 0.1–0.9)
Monocytes: 12 %
NEUTROS ABS: 2.4 10*3/uL (ref 1.4–7.0)
Neutrophils: 52 %
PLATELETS: 260 10*3/uL (ref 150–379)
RBC: 4.7 x10E6/uL (ref 4.14–5.80)
RDW: 12.9 % (ref 12.3–15.4)
WBC: 4.7 10*3/uL (ref 3.4–10.8)

## 2017-06-26 LAB — BASIC METABOLIC PANEL
BUN / CREAT RATIO: 13 (ref 9–20)
BUN: 12 mg/dL (ref 6–20)
CHLORIDE: 99 mmol/L (ref 96–106)
CO2: 27 mmol/L (ref 20–29)
Calcium: 10 mg/dL (ref 8.7–10.2)
Creatinine, Ser: 0.94 mg/dL (ref 0.76–1.27)
GFR calc Af Amer: 122 mL/min/{1.73_m2} (ref >59)
GFR calc non Af Amer: 105 mL/min/{1.73_m2} (ref >59)
GLUCOSE: 100 mg/dL — AB (ref 65–99)
POTASSIUM: 4.1 mmol/L (ref 3.5–5.2)
SODIUM: 141 mmol/L (ref 134–144)

## 2017-06-26 LAB — PSA: Prostate Specific Ag, Serum: 1 ng/mL (ref 0.0–4.0)

## 2017-06-26 LAB — TSH: TSH: 0.12 u[IU]/mL — AB (ref 0.450–4.500)

## 2017-06-26 NOTE — Addendum Note (Signed)
Addended by: Loney HeringBATTEN, LAURA E on: 06/26/2017 01:34 PM   Modules accepted: Orders

## 2017-06-27 LAB — SPECIMEN STATUS REPORT

## 2017-06-27 LAB — LIPID PANEL
CHOL/HDL RATIO: 3.2 ratio (ref 0.0–5.0)
Cholesterol, Total: 170 mg/dL (ref 100–199)
HDL: 53 mg/dL (ref >39)
LDL Calculated: 102 mg/dL — ABNORMAL HIGH (ref 0–99)
Triglycerides: 73 mg/dL (ref 0–149)
VLDL Cholesterol Cal: 15 mg/dL (ref 5–40)

## 2017-06-27 LAB — VITAMIN D 25 HYDROXY (VIT D DEFICIENCY, FRACTURES): VIT D 25 HYDROXY: 29.1 ng/mL — AB (ref 30.0–100.0)

## 2017-06-28 LAB — T3, FREE: T3 FREE: 3.7 pg/mL (ref 2.0–4.4)

## 2017-06-28 LAB — T4: T4, Total: 9.2 ug/dL (ref 4.5–12.0)

## 2017-06-28 LAB — SPECIMEN STATUS REPORT

## 2017-07-02 ENCOUNTER — Telehealth: Payer: Self-pay | Admitting: Family Medicine

## 2017-07-02 ENCOUNTER — Other Ambulatory Visit: Payer: Self-pay | Admitting: Family Medicine

## 2017-07-02 DIAGNOSIS — G8929 Other chronic pain: Secondary | ICD-10-CM

## 2017-07-02 DIAGNOSIS — E559 Vitamin D deficiency, unspecified: Secondary | ICD-10-CM

## 2017-07-02 DIAGNOSIS — R7989 Other specified abnormal findings of blood chemistry: Secondary | ICD-10-CM

## 2017-07-02 DIAGNOSIS — M4316 Spondylolisthesis, lumbar region: Secondary | ICD-10-CM

## 2017-07-02 DIAGNOSIS — M545 Low back pain: Secondary | ICD-10-CM

## 2017-07-02 MED ORDER — ERGOCALCIFEROL 1.25 MG (50000 UT) PO CAPS: 50000.0000 [IU] | ORAL_CAPSULE | ORAL | 0 refills | Status: DC

## 2017-07-02 NOTE — Telephone Encounter (Signed)
Jesse Long, a 37 year old patient with a history of chronic low back pain presented to establish care on 06/25/2017. Back xray showed the following results:  IMPRESSION: 1.  No acute osseous abnormality identified in the lumbar spine. 2. Lumbar disc degeneration appears most pronounced at L2-L3 where there is subtle chronic spondylolisthesis.    Will send a referral to orthopedic services for further workup and evaluation.    [name]  MSN, FNP-C Patient Care [facility] [address]  [place], [place] [address] [phone]

## 2017-07-10 ENCOUNTER — Ambulatory Visit (INDEPENDENT_AMBULATORY_CARE_PROVIDER_SITE_OTHER): Payer: Self-pay | Admitting: Surgery

## 2017-07-10 ENCOUNTER — Encounter (INDEPENDENT_AMBULATORY_CARE_PROVIDER_SITE_OTHER): Payer: Self-pay | Admitting: Surgery

## 2017-07-10 DIAGNOSIS — M5416 Radiculopathy, lumbar region: Secondary | ICD-10-CM

## 2017-07-10 MED ORDER — METHYLPREDNISOLONE 4 MG PO TABS: ORAL_TABLET | ORAL | 0 refills | Status: DC

## 2017-07-10 NOTE — Progress Notes (Signed)
Office Visit Note   Patient: Jesse Long           Date of Birth: 09/27/1980           MRN: [id number] Visit Date: 07/10/2017              Requested by: Massie MaroonHollis, Lachina M, FNP [address] [place], [place] [address] PCP: Massie MaroonHollis, Lachina M, FNP   Assessment & Plan: Visit Diagnoses:  1. Radiculopathy, lumbar region     Plan: With patient's ongoing symptoms that have failed conservative treatment I will schedule lumbar MRI to rule out HNP/stenosis.  Follow-up in the office after completion of his study to discuss results with Dr. Otelia SergeantNitka.  Prescription given for prednisone taper.  Follow-Up Instructions: Return in about 2 weeks (around 07/24/2017) for With Dr. Otelia SergeantNitka to review MRI.   Orders:  Orders Placed This Encounter  Procedures  . MR Lumbar Spine w/o contrast   Meds ordered this encounter  Medications  . methylPREDNISolone (MEDROL) 4 MG tablet    Sig: 6-day taper to be taken as directed    Dispense:  21 tablet    Refill:  0      Procedures: No procedures performed   Clinical Data: No additional findings.   Subjective: Chief Complaint  Patient presents with  . Lower Back - Pain    HPI Patient comes in today with complaints of worsening low back pain and left lower extremity radiculopathy.  Patient states that he has had chronic back pain since 2005 but this is been progressively getting worse since April 2018.  Last April he began having increased pain that radiated to the left buttock down to his left foot.  This worse when he is standing for prolonged periods.  Numbness and tingling down the left leg as well.  Has pain radiating down to the right buttock.  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  No complaints of bowel or bladder con's.  No feeling of leg weakness.  He has failed conservative treatment with oral NSAIDs, gabapentin and activity modification.  He was seen in the emergency room November 29, 2016 due to worsening symptoms and had  Toradol and Depo-Medrol IM injections again without any improvement.  X-rays lumbar spine for over 22,019 report read no acute osseous abnormality identified in the lumbar spine.  Lumbar disc degeneration appears most pronounced at L2-3 where there is subtle chronic spondylolisthesis. Review of Systems No current cardiac pulmonary GI GU issues  Objective: Vital Signs: There were no vitals taken for this visit.  Physical Exam  Constitutional: He is oriented to person, place, and time. No distress.  HENT:  Head: Normocephalic.  Eyes: EOM are normal. Pupils are equal, round, and reactive to light.  Neck: Normal range of motion.  Pulmonary/Chest: No respiratory distress.  Abdominal: He exhibits no distension.  Musculoskeletal:  Patient has pain with lumbar extension.  Lumbar flexion with hands to ankles with some discomfort.  Mild lumbar paraspinal tenderness.  Positive left greater than right sciatic notch tenderness.  Negative logroll.  Pain with left straight leg raise.  Bilateral calves nontender.  No focal motor deficits.  Neurological: He is alert and oriented to person, place, and time.  Skin: Skin is warm and dry.    Ortho Exam  Specialty Comments:  No specialty comments available.  Imaging: No results found.   PMFS History: Patient Active Problem List   Diagnosis Date Noted  . Tension  type headache 09/20/2014  . Vitamin D insufficiency 08/26/2014  . Constipation 08/24/2014  . Unintentional weight loss 08/24/2014  . Anxiety   . Depression    Past Medical History:  Diagnosis Date  . Anxiety 2000   anxiety with anger issues. since HS.   . Depression 2000   since HS. no SI. just nothing makes you happy   . No pertinent past medical history   . Palpitations   . Panic attack 2016  . Polysubstance abuse (HCC) 05/2013   cocaine tried the night before first panic attack, weed in the past     Family History  Adopted: Yes    Past Surgical History:  Procedure  Laterality Date  . I&D EXTREMITY  03/08/2012   Procedure: IRRIGATION AND DEBRIDEMENT EXTREMITY;  Surgeon: Velna Ochs, MD;  Location: [facility] OR;  Service: Orthopedics;  Laterality: Left;  . leg  2013   surgery to remove forigen body in left leg    Social History   Occupational History  . Occupation: Chef     Comment: Chief Financial Officer   Tobacco Use  . Smoking status: Former Smoker    Packs/day: 0.25    Types: Cigarettes    Last attempt to quit: 08/11/2013    Years since quitting: 3.9  . Smokeless tobacco: Never Used  Substance and Sexual Activity  . Alcohol use: No    Alcohol/week: 0.0 oz    Frequency: Never  . Drug use: Yes    Types: Oxycodone  . Sexual activity: Not Currently    Partners: Female    Comment: last drug used Jan 1,2016

## 2017-07-17 ENCOUNTER — Ambulatory Visit
Admission: RE | Admit: 2017-07-17 | Discharge: 2017-07-17 | Disposition: A | Discharge status: Home / Self Care | Payer: BLUE CROSS/BLUE SHIELD | Source: Ambulatory Visit | Attending: Surgery | Admitting: Surgery

## 2017-07-17 DIAGNOSIS — M5416 Radiculopathy, lumbar region: Secondary | ICD-10-CM

## 2017-07-24 ENCOUNTER — Ambulatory Visit: Payer: BLUE CROSS/BLUE SHIELD | Admitting: Family Medicine

## 2017-07-31 ENCOUNTER — Ambulatory Visit: Payer: BLUE CROSS/BLUE SHIELD | Admitting: Family Medicine

## 2017-08-14 ENCOUNTER — Ambulatory Visit (INDEPENDENT_AMBULATORY_CARE_PROVIDER_SITE_OTHER): Payer: BLUE CROSS/BLUE SHIELD | Admitting: Family Medicine

## 2017-08-14 ENCOUNTER — Encounter: Payer: Self-pay | Admitting: Family Medicine

## 2017-08-14 VITALS — BP 132/78 | HR 76 | Temp 98.3°F | Resp 16 | Ht 74.0 in | Wt 178.0 lb

## 2017-08-14 DIAGNOSIS — M5430 Sciatica, unspecified side: Secondary | ICD-10-CM | POA: Diagnosis not present

## 2017-08-14 DIAGNOSIS — M549 Dorsalgia, unspecified: Secondary | ICD-10-CM

## 2017-08-14 DIAGNOSIS — N419 Inflammatory disease of prostate, unspecified: Secondary | ICD-10-CM

## 2017-08-14 DIAGNOSIS — E559 Vitamin D deficiency, unspecified: Secondary | ICD-10-CM | POA: Diagnosis not present

## 2017-08-14 MED ORDER — TAMSULOSIN HCL 0.4 MG PO CAPS: 0.4000 mg | ORAL_CAPSULE | Freq: Every day | ORAL | 5 refills | Status: DC

## 2017-08-14 MED ORDER — ERGOCALCIFEROL 1.25 MG (50000 UT) PO CAPS: 50000.0000 [IU] | ORAL_CAPSULE | ORAL | 1 refills | Status: DC

## 2017-08-14 NOTE — Progress Notes (Signed)
Chief Complaint  Patient presents with  . Follow-up    Subjective:    Patient ID: Jesse Long, male    DOB: 02-04-1981, 37 y.o.   MRN: [id number]  HPI Jesse Long, a 37 year old male with a history of low back pain with sciatica and vitamin D insufficiency presents for a follow up of low back pain with sciatica and vitamin D deficiency.  Jesse Long has a history of right back pain with sciatica to left leg.  Patient states that pain has been present for greater than 4 months. A referral was sent to orthopedic services and established care Zonia Kief, PA on 07/10/2017. Pain improved after prednisone taper. Patient typically takes OTC analgesics.  Exacerbating factors identifiable by patient are bending backwards, bending forwards, bending sideways, sitting, standing and walking uphill.  Patient is scheduled for MRI.    Past Medical History:  Diagnosis Date  . Anxiety 2000   anxiety with anger issues. since HS.   . Depression 2000   since HS. no SI. just nothing makes you happy   . No pertinent past medical history   . Palpitations   . Panic attack 2016  . Polysubstance abuse (HCC) 05/2013   cocaine tried the night before first panic attack, weed in the past    Social History   Socioeconomic History  . Marital status: Single    Spouse name: Not on file  . Number of children: 0  . Years of education: college   . Highest education level: Not on file  Occupational History  . Occupation: Chef     Comment: Chief Financial Officer   Social Needs  . Financial resource strain: Not on file  . Food insecurity:    Worry: Not on file    Inability: Not on file  . Transportation needs:    Medical: Not on file    Non-medical: Not on file  Tobacco Use  . Smoking status: Former Smoker    Packs/day: 0.25    Types: Cigarettes    Last attempt to quit: 08/11/2013    Years since quitting: 4.0  . Smokeless tobacco: Never Used  Substance and Sexual Activity  . Alcohol use: No    Alcohol/week: 0.0 oz     Frequency: Never  . Drug use: Yes    Types: Oxycodone  . Sexual activity: Not Currently    Partners: Female    Comment: last drug used Jan 1,2016  Lifestyle  . Physical activity:    Days per week: Not on file    Minutes per session: Not on file  . Stress: Not on file  Relationships  . Social connections:    Talks on phone: Not on file    Gets together: Not on file    Attends religious service: Not on file    Active member of club or organization: Not on file    Attends meetings of clubs or organizations: Not on file    Relationship status: Not on file  . Intimate partner violence:    Fear of current or ex partner: Not on file    Emotionally abused: Not on file    Physically abused: Not on file    Forced sexual activity: Not on file  Other Topics Concern  . Not on file  Social History Narrative   Lives with brother (not a blood relative).    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 Tdap 12/21/2014  Review of Systems  HENT: Negative.   Eyes: Negative for visual disturbance.  Respiratory: Negative.   Cardiovascular: Negative.   Gastrointestinal: Negative.   Endocrine: Negative for polyphagia.  Musculoskeletal: Positive for back pain.  Allergic/Immunologic: Negative for immunocompromised state.  Neurological: Negative.   Hematological: Negative.   Psychiatric/Behavioral: Negative.        Objective:   Physical Exam  Constitutional: He is oriented to person, place, and time.  HENT:  Head: Normocephalic and atraumatic.  Right Ear: External ear normal.  Left Ear: External ear normal.  Nose: Nose normal.  Mouth/Throat: Oropharynx is clear and moist.  Eyes: Pupils are equal, round, and reactive to light.  Neck: Normal range of motion. Neck supple.  Cardiovascular: Normal rate, regular rhythm, normal heart sounds and intact distal pulses.  Pulmonary/Chest: Effort normal and breath sounds normal.  Abdominal:  Soft. Bowel sounds are normal.  Genitourinary: Rectum normal.  Musculoskeletal: Normal range of motion.  Neurological: He is alert and oriented to person, place, and time. He has normal reflexes.  Skin: Skin is warm and dry.  Psychiatric: He has a normal mood and affect. His behavior is normal. Judgment and thought content normal.      BP 132/78 (BP Location: Left Arm, Patient Position: Sitting, Cuff Size: Normal)   Pulse 76   Temp 98.3 F (36.8 C) (Oral)   Resp 16   Ht 6\' 2"  (1.88 m)   Wt 178 lb (80.7 kg)   SpO2 100%   BMI 22.85 kg/m  Assessment & Plan:   Vitamin D insufficiency - ergocalciferol (DRISDOL) 50000 units capsule; Take 1 capsule (50,000 Units total) by mouth once a week.  Dispense: 30 capsule; Refill: 1  Back pain with sciatica Follow up with orthopedic services as scheduled.  Continue conservative management as discussed.  - gabapentin (NEURONTIN) 100 MG capsule; Take 1 capsule (100 mg total) by mouth 3 (three) times daily.  Dispense: 90 capsule; Refill: 0  Prostatitis, unspecified prostatitis type - tamsulosin (FLOMAX) 0.4 MG CAPS capsule; Take 1 capsule (0.4 mg total) by mouth daily.  Dispense: 30 capsule; Refill: 5   RTC: 6 months for prostatitis   Nolon NationsLachina Moore Joushua Dugar  MSN, FNP-C Patient Care [facility] [address]  [place], [place] [address] [phone]

## 2017-08-21 ENCOUNTER — Ambulatory Visit (INDEPENDENT_AMBULATORY_CARE_PROVIDER_SITE_OTHER): Payer: Self-pay | Admitting: Specialist

## 2017-08-26 ENCOUNTER — Ambulatory Visit (INDEPENDENT_AMBULATORY_CARE_PROVIDER_SITE_OTHER): Payer: Self-pay | Admitting: Specialist

## 2017-09-04 ENCOUNTER — Encounter (INDEPENDENT_AMBULATORY_CARE_PROVIDER_SITE_OTHER): Payer: Self-pay | Admitting: Specialist

## 2017-09-04 ENCOUNTER — Ambulatory Visit (INDEPENDENT_AMBULATORY_CARE_PROVIDER_SITE_OTHER): Payer: BLUE CROSS/BLUE SHIELD | Admitting: Specialist

## 2017-09-04 VITALS — BP 117/67 | HR 80 | Ht 74.0 in | Wt 178.0 lb

## 2017-09-04 DIAGNOSIS — M5126 Other intervertebral disc displacement, lumbar region: Secondary | ICD-10-CM

## 2017-09-04 DIAGNOSIS — M5136 Other intervertebral disc degeneration, lumbar region: Secondary | ICD-10-CM

## 2017-09-04 MED ORDER — TRAMADOL HCL 50 MG PO TABS: 50.0000 mg | ORAL_TABLET | Freq: Four times a day (QID) | ORAL | 0 refills | Status: AC | PRN

## 2017-09-04 MED ORDER — GABAPENTIN 300 MG PO CAPS: ORAL_CAPSULE | ORAL | 0 refills | Status: DC

## 2017-09-04 NOTE — Progress Notes (Signed)
Office Visit Note   Patient: Jesse Long           Date of Birth: 06-30-1980           MRN: [id number] Visit Date: 09/04/2017              Requested by: Massie Maroon, FNP [address] [place], [place] [address] PCP: Massie Maroon, FNP   Assessment & Plan: Visit Diagnoses:  1. 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 Avoid prolong standing and walking. Avoid frequent bending and stooping  No lifting greater than 10 lbs. May use ice or moist heat for pain. Weight loss is of benefit. Handicap license is approved. Dr. Emanuel Blas secretary/Assistant will call to arrange for epidural steroid injection   Follow-Up Instructions: Return in about 1 month (around 10/02/2017).   Orders:  Orders Placed This Encounter  Procedures  . Ambulatory referral to Physical Therapy  . Ambulatory referral to Physical Medicine Rehab   Meds ordered this encounter  Medications  . gabapentin (NEURONTIN) 300 MG capsule    Sig: Take 1 capsule (300 mg total) by mouth 2 (two) times daily for 7 days, THEN 1 capsule (300 mg total) 3 (three) times daily for 23 days.    Dispense:  83 capsule    Refill:  0  . traMADol (ULTRAM) 50 MG tablet    Sig: Take 1 tablet (50 mg total) by mouth every 6 (six) hours as needed for up to 7 days.    Dispense:  40 tablet    Refill:  0      Procedures: No procedures performed   Clinical Data: No additional findings.   Subjective: Chief Complaint  Patient presents with  . Lower Back - Follow-up    37 year old male with history of back pain and radiation into the left leg with sciatica. It began in April one year ago and he had to wait until e had coverage with BC/BS through [facility] Before he could be seen. He reports that the pain into the left leg is definitely worse since  last year. His pain  1-10 is a "2" with sitting. With standing and walking it increases to a "9". It is mostly pain with standing and walking. Previously he had numbness in the left thigh and leg Goes from the left mid buttock into the left posterolateral thigh and into the left anterolateral leg and into the top of the left foot. No bowel or bladder dfficulty. He takes flomax for some prostate enlargement. He is taking alleve and tylenol at work and reports these  meds only help for about 30 minutes.    Review of Systems  Constitutional: Positive for activity change, appetite change, diaphoresis, fatigue, fever and unexpected weight change.  HENT: Positive for postnasal drip, rhinorrhea, sinus pressure, sinus pain and sneezing. Negative for congestion.   Eyes: Positive for visual disturbance. Negative for photophobia, pain, discharge, redness and itching.  Respiratory: Negative.  Negative for cough, choking, chest tightness, shortness of breath, wheezing and stridor.   Cardiovascular: Negative.  Negative for chest pain, palpitations and leg swelling.  Gastrointestinal: Negative.  Negative for abdominal distention, abdominal pain, anal bleeding, blood in stool, constipation, diarrhea, nausea and rectal pain.  Endocrine: Negative for cold intolerance, heat intolerance, polydipsia, polyphagia and polyuria.  Genitourinary:  Positive for frequency. Negative for difficulty urinating, dysuria, enuresis, flank pain and genital sores.  Musculoskeletal: Positive for back pain and gait problem. Negative for arthralgias, joint swelling, myalgias, neck pain and neck stiffness.  Skin: Negative.  Negative for color change, pallor, rash and wound.  Allergic/Immunologic: Negative for environmental allergies, food allergies and immunocompromised state.  Neurological: Positive for numbness. Negative for dizziness, tremors, seizures, syncope, facial asymmetry, speech difficulty, weakness, light-headedness and  headaches.  Hematological: Negative.  Negative for adenopathy. Does not bruise/bleed easily.  Psychiatric/Behavioral: Negative.  Negative for agitation, behavioral problems, confusion, decreased concentration, dysphoric mood, hallucinations, self-injury, sleep disturbance and suicidal ideas. The patient is not nervous/anxious and is not hyperactive.      Objective: Vital Signs: BP 117/67   Pulse 80   Ht  (1.88 m)   Wt 178 lb (80.7 kg)   BMI 22.85 kg/m   Physical Exam  Constitutional: He is oriented to person, place, and time. He appears well-developed and well-nourished.  HENT:  Head: Normocephalic and atraumatic.  Eyes: Pupils are equal, round, and reactive to light. EOM are normal.  Neck: Normal range of motion. Neck supple.  Pulmonary/Chest: Effort normal and breath sounds normal.  Abdominal: Soft. Bowel sounds are normal.  Neurological: He is alert and oriented to person, place, and time.  Skin: Skin is warm and dry.  Psychiatric: He has a normal mood and affect. His behavior is normal. Judgment and thought content normal.    Back Exam   Tenderness  The patient is experiencing tenderness in the lumbar.  Range of Motion  Extension: abnormal  Flexion: normal  Lateral bend right: normal  Lateral bend left: normal  Rotation right: normal  Rotation left: normal   Muscle Strength  Right Quadriceps:  5/5  Left Quadriceps:  4/5  Right Hamstrings:  5/5  Left Hamstrings:  5/5   Tests  Straight leg raise right: negative Straight leg raise left: negative  Reflexes  Patellar:  3/4 normal Achilles: normal  Other  Toe walk: normal Heel walk: normal Sensation: normal Gait: normal  Scars: absent  Comments:  SLR is negative       Specialty Comments:  No specialty comments available.  Imaging: No results found.   PMFS History: Patient Active Problem List   Diagnosis Date Noted  . Tension type headache 09/20/2014  . Vitamin D insufficiency 08/26/2014   . Constipation 08/24/2014  . Unintentional weight loss 08/24/2014  . Anxiety   . Depression    Past Medical History:  Diagnosis Date  . Anxiety 2000   anxiety with anger issues. since HS.   . Depression 2000   since HS. no SI. just nothing makes you happy   . No pertinent past medical history   . Palpitations   . Panic attack 2016  . Polysubstance abuse (HCC) 05/2013   cocaine tried the night before first panic attack, weed in the past     Family History  Adopted: Yes    Past Surgical History:  Procedure Laterality Date  . I&D EXTREMITY  03/08/2012   Procedure: IRRIGATION AND DEBRIDEMENT EXTREMITY;  Surgeon: Velna Ochs, MD;  Location: [facility] OR;  Service: Orthopedics;  Laterality: Left;  . leg  2013   surgery to remove forigen body in left leg    Social History   Occupational History  . Occupation: Chef     Comment: Chief Financial Officer   Tobacco Use  . Smoking status: Former Smoker    Packs/day: 0.25  Types: Cigarettes    Last attempt to quit: 08/11/2013    Years since quitting: 4.0  . Smokeless tobacco: Never Used  Substance and Sexual Activity  . Alcohol use: No    Alcohol/week: 0.0 oz    Frequency: Never  . Drug use: Yes    Types: Oxycodone  . Sexual activity: Not Currently    Partners: Female    Comment: last drug used Jan 1,2016

## 2017-09-04 NOTE — Patient Instructions (Signed)
Avoid bending, stooping and avoid lifting weights greater than 10 lbs. Avoid prolong standing and walking. Avoid frequent bending and stooping  No lifting greater than 10 lbs. May use ice or moist heat for pain. Weight loss is of benefit. Handicap license is approved. Dr. Newton's secretary/Assistant will call to arrange for epidural steroid injection  

## 2017-09-23 ENCOUNTER — Ambulatory Visit (INDEPENDENT_AMBULATORY_CARE_PROVIDER_SITE_OTHER): Payer: BLUE CROSS/BLUE SHIELD

## 2017-09-23 ENCOUNTER — Ambulatory Visit (INDEPENDENT_AMBULATORY_CARE_PROVIDER_SITE_OTHER): Payer: BLUE CROSS/BLUE SHIELD | Admitting: Physical Medicine and Rehabilitation

## 2017-09-23 ENCOUNTER — Encounter (INDEPENDENT_AMBULATORY_CARE_PROVIDER_SITE_OTHER): Payer: Self-pay | Admitting: Physical Medicine and Rehabilitation

## 2017-09-23 VITALS — BP 123/79 | HR 85

## 2017-09-23 DIAGNOSIS — M5416 Radiculopathy, lumbar region: Secondary | ICD-10-CM | POA: Diagnosis not present

## 2017-09-23 MED ORDER — BETAMETHASONE SOD PHOS & ACET 6 (3-3) MG/ML IJ SUSP
12.0000 mg | Freq: Once | INTRAMUSCULAR | Status: AC
  Administered 2017-09-23: 12 mg

## 2017-09-23 NOTE — Progress Notes (Signed)
 .  Numeric Pain Rating Scale and Functional Assessment Average Pain 8   In the last MONTH (on 0-10 scale) has pain interfered with the following?  1. General activity like being  able to carry out your everyday physical activities such as walking, climbing stairs, carrying groceries, or moving a chair?  Rating(4)   +Driver, -BT, -Dye Allergies.  

## 2017-09-23 NOTE — Patient Instructions (Signed)

## 2017-09-28 ENCOUNTER — Other Ambulatory Visit (INDEPENDENT_AMBULATORY_CARE_PROVIDER_SITE_OTHER): Payer: Self-pay | Admitting: Specialist

## 2017-09-30 NOTE — Telephone Encounter (Signed)
I called rx to [facility] 

## 2017-09-30 NOTE — Telephone Encounter (Signed)
tramadol refill request 

## 2017-10-03 NOTE — Procedures (Signed)
Lumbosacral Transforaminal Epidural Steroid Injection - Sub-Pedicular Approach with Fluoroscopic Guidance  Patient: Jesse Long      Date of Birth: 04/01/1981 MRN: [id number] PCP: Massie MaroonHollis, Lachina M, FNP      Visit Date: 09/23/2017   Universal Protocol:    Date/Time: 09/23/2017  Consent Given By: the patient  Position: PRONE  Additional Comments: Vital signs were monitored before and after the procedure. Patient was prepped and draped in the usual sterile fashion. The correct patient, procedure, and site was verified.   Injection Procedure Details:  Procedure Site One Meds Administered:  Meds ordered this encounter  Medications  . betamethasone acetate-betamethasone sodium phosphate (CELESTONE) injection 12 mg    Laterality: Left  Location/Site:  L4-L5  Needle size: 22 G  Needle type: Spinal  Needle Placement: Transforaminal  Findings:    -Comments: Excellent flow of contrast along the nerve and into the epidural space.  Procedure Details: After squaring off the end-plates to get a true AP view, the C-arm was positioned so that an oblique view of the foramen as noted above was visualized. The target area is just inferior to the "nose of the scotty dog" or sub pedicular. The soft tissues overlying this structure were infiltrated with 2-3 ml. of 1% Lidocaine without Epinephrine.  The spinal needle was inserted toward the target using a "trajectory" view along the fluoroscope beam.  Under AP and lateral visualization, the needle was advanced so it did not puncture dura and was located close the 6 O'Clock position of the pedical in AP tracterory. Biplanar projections were used to confirm position. Aspiration was confirmed to be negative for CSF and/or blood. A 1-2 ml. volume of Isovue-250 was injected and flow of contrast was noted at each level. Radiographs were obtained for documentation purposes.   After attaining the desired flow of contrast documented above, a 0.5 to  1.0 ml test dose of 0.25% Marcaine was injected into each respective transforaminal space.  The patient was observed for 90 seconds post injection.  After no sensory deficits were reported, and normal lower extremity motor function was noted,   the above injectate was administered so that equal amounts of the injectate were placed at each foramen (level) into the transforaminal epidural space.   Additional Comments:  The patient tolerated the procedure well Dressing: Band-Aid    Post-procedure details: Patient was observed during the procedure. Post-procedure instructions were reviewed.  Patient left the clinic in stable condition.

## 2017-10-03 NOTE — Progress Notes (Signed)
[name] - 37 y.o. male MRN [id number]  Date of birth: 05/15/1980  Office Visit Note: Visit Date: 09/23/2017 PCP: Massie Maroon, FNP Referred by: Massie Maroon, FNP  Subjective: Chief Complaint  Patient presents with  . Lower Back - Pain  . Left Leg - Pain   HPI: Jesse Long is a 37 year old gentleman comes in today at the request of Dr. Vira Browns for diagnostic and therapeutic left L4 transforaminal steroid injection.  MRI findings show left moderate stenosis at L4 with marginal osteophytes foraminal and extraforaminally. The injection  will be diagnostic and hopefully therapeutic. The patient has failed conservative care including time, medications and activity modification.   ROS Otherwise per HPI.  Assessment & Plan: Visit Diagnoses:  1. Lumbar radiculopathy     Plan: No additional findings.   Meds & Orders:  Meds ordered this encounter  Medications  . betamethasone acetate-betamethasone sodium phosphate (CELESTONE) injection 12 mg    Orders Placed This Encounter  Procedures  . XR C-ARM NO REPORT  . Epidural Steroid injection    Follow-up: Return for Dr. Vira Browns.   Procedures: No procedures performed  Lumbosacral Transforaminal Epidural Steroid Injection - Sub-Pedicular Approach with Fluoroscopic Guidance  Patient: Jesse Long      Date of Birth: 17-Aug-1980 MRN: [id number] PCP: Massie Maroon, FNP      Visit Date: 09/23/2017   Universal Protocol:    Date/Time: 09/23/2017  Consent Given By: the patient  Position: PRONE  Additional Comments: Vital signs were monitored before and after the procedure. Patient was prepped and draped in the usual sterile fashion. The correct patient, procedure, and site was verified.   Injection Procedure Details:  Procedure Site One Meds Administered:  Meds ordered this encounter  Medications  . betamethasone acetate-betamethasone sodium phosphate (CELESTONE) injection 12 mg    Laterality:  Left  Location/Site:  L4-L5  Needle size: 22 G  Needle type: Spinal  Needle Placement: Transforaminal  Findings:    -Comments: Excellent flow of contrast along the nerve and into the epidural space.  Procedure Details: After squaring off the end-plates to get a true AP view, the C-arm was positioned so that an oblique view of the foramen as noted above was visualized. The target area is just inferior to the "nose of the scotty dog" or sub pedicular. The soft tissues overlying this structure were infiltrated with 2-3 ml. of 1% Lidocaine without Epinephrine.  The spinal needle was inserted toward the target using a "trajectory" view along the fluoroscope beam.  Under AP and lateral visualization, the needle was advanced so it did not puncture dura and was located close the 6 O'Clock position of the pedical in AP tracterory. Biplanar projections were used to confirm position. Aspiration was confirmed to be negative for CSF and/or blood. A 1-2 ml. volume of Isovue-250 was injected and flow of contrast was noted at each level. Radiographs were obtained for documentation purposes.   After attaining the desired flow of contrast documented above, a 0.5 to 1.0 ml test dose of 0.25% Marcaine was injected into each respective transforaminal space.  The patient was observed for 90 seconds post injection.  After no sensory deficits were reported, and normal lower extremity motor function was noted,   the above injectate was administered so that equal amounts of the injectate were placed at each foramen (level) into the transforaminal epidural space.   Additional Comments:  The patient tolerated the procedure well Dressing: Band-Aid    Post-procedure details:  Patient was observed during the procedure. Post-procedure instructions were reviewed.  Patient left the clinic in stable condition.    Clinical History: MRI LUMBAR SPINE WITHOUT CONTRAST  TECHNIQUE: Multiplanar, multisequence MR  imaging of the lumbar spine was performed. No intravenous contrast was administered.  COMPARISON:  06/25/2017 lumbar spine radiograph  FINDINGS: Segmentation:  Standard.  Alignment:  Straightening of lordosis without listhesis.  Vertebrae: L3 superior endplate small Schmorl's node with mild edema. Otherwise no bone marrow edema or abnormal disc signal.  Conus medullaris and cauda equina: Conus extends to the L1-2 level. Conus and cauda equina appear normal.  Paraspinal and other soft tissues: Negative.  Disc levels:  T12-L1: No significant disc displacement, foraminal stenosis, or canal stenosis.  L1-2: No significant disc displacement, foraminal stenosis, or canal stenosis.  L2-3: Small disc bulge eccentric to the right and right extraforaminal disc protrusion with annular fissure. Mild right foraminal stenosis and protrusion contact on the exiting right L2 nerve root in the extraforaminal zone (series 4, image 3). No canal stenosis.  L3-4: Small disc bulge eccentric to the right with mild right foraminal stenosis and mild canal stenosis.  L4-5: Small disc bulge and left-sided foraminal and extraforaminal marginal osteophytes. Mild right and moderate left foraminal stenosis. Mild canal stenosis and left greater than right lateral recess stenosis.  L5-S1: Small disc bulge eccentric to the left. Mild left foraminal stenosis. No right foraminal stenosis or canal stenosis.  IMPRESSION: 1. L3 superior endplate small Schmorl's node with mild edema. 2. Straightening of lumbar lordosis without listhesis. 3. Multilevel discogenic degenerative changes with disc desiccation and disc space narrowing. 4. Mild L3-4 and L4-5 canal stenosis. 5. L2-3 small right foraminal protrusion with contact on exiting L2 nerve root. 6. Moderate left L4-5 foraminal stenosis. Multilevel mild foraminal stenosis.   Electronically Signed   By: Mitzi Hansen M.D.    On: 07/17/2017 19:12   He reports that he quit smoking about 4 years ago. His smoking use included cigarettes. He smoked 0.25 packs per day. He has never used smokeless tobacco.  Recent Labs    06/25/17 1011  HGBA1C 5.0    Objective:  VS:  HT:    WT:   BMI:     BP:123/79  HR:85bpm  TEMP: ( )  RESP:  Physical Exam  Ortho Exam Imaging: No results found.  Past Medical/Family/Surgical/Social History: Medications & Allergies reviewed per EMR, new medications updated. Patient Active Problem List   Diagnosis Date Noted  . Tension type headache 09/20/2014  . Vitamin D insufficiency 08/26/2014  . Constipation 08/24/2014  . Unintentional weight loss 08/24/2014  . Anxiety   . Depression    Past Medical History:  Diagnosis Date  . Anxiety 2000   anxiety with anger issues. since HS.   . Depression 2000   since HS. no SI. just nothing makes you happy   . No pertinent past medical history   . Palpitations   . Panic attack 2016  . Polysubstance abuse (HCC) 05/2013   cocaine tried the night before first panic attack, weed in the past    Family History  Adopted: Yes   Past Surgical History:  Procedure Laterality Date  . I&D EXTREMITY  03/08/2012   Procedure: IRRIGATION AND DEBRIDEMENT EXTREMITY;  Surgeon: Velna Ochs, MD;  Location: [facility] OR;  Service: Orthopedics;  Laterality: Left;  . leg  2013   surgery to remove forigen body in left leg    Social History   Occupational History  .  Occupation: Chef     Comment: Chief Financial Officer   Tobacco Use  . Smoking status: Former Smoker    Packs/day: 0.25    Types: Cigarettes    Last attempt to quit: 08/11/2013    Years since quitting: 4.1  . Smokeless tobacco: Never Used  Substance and Sexual Activity  . Alcohol use: No    Alcohol/week: 0.0 oz    Frequency: Never  . Drug use: Yes    Types: Oxycodone  . Sexual activity: Not Currently    Partners: Female    Comment: last drug used Jan 1,2016

## 2017-10-15 ENCOUNTER — Ambulatory Visit (INDEPENDENT_AMBULATORY_CARE_PROVIDER_SITE_OTHER): Payer: BLUE CROSS/BLUE SHIELD | Admitting: Specialist

## 2018-02-10 ENCOUNTER — Telehealth: Payer: Self-pay

## 2018-02-10 NOTE — Telephone Encounter (Signed)
Called, no answer. Left a message to remind patient of appointment on 02/12/2018. Thanks!  

## 2018-02-12 ENCOUNTER — Ambulatory Visit: Payer: BLUE CROSS/BLUE SHIELD | Admitting: Family Medicine

## 2019-01-13 ENCOUNTER — Encounter (HOSPITAL_COMMUNITY): Payer: Self-pay

## 2019-01-13 ENCOUNTER — Encounter (HOSPITAL_COMMUNITY): Payer: Self-pay | Admitting: *Deleted

## 2019-03-14 IMAGING — CT CT ABD-PELV W/ CM
2 of 4 series · 16 of 46 positions shown, 18 images · IV contrast (iopamidol)
Comparison: 06/09/2014

CLINICAL DATA: Right lower quadrant pain for 3 days

EXAM:
CT ABDOMEN AND PELVIS WITH CONTRAST
TECHNIQUE: Multidetector CT imaging of the abdomen and pelvis was performed
using the standard protocol following bolus administration of
intravenous contrast.
CONTRAST:  100mL 7FIHDM-RDD IOPAMIDOL (7FIHDM-RDD) INJECTION 61%

[Series 4: abd/ pelvis 5.0 i30f 2 · axial · 0.72mm/px · z∈[+748,+1143]mm · 13 of 87 slices shown, 15 images]
[im 4/87  soft-tissue]
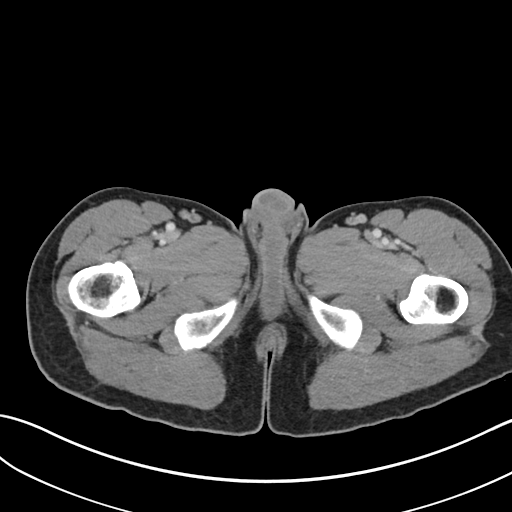
[im 4/87  bone]
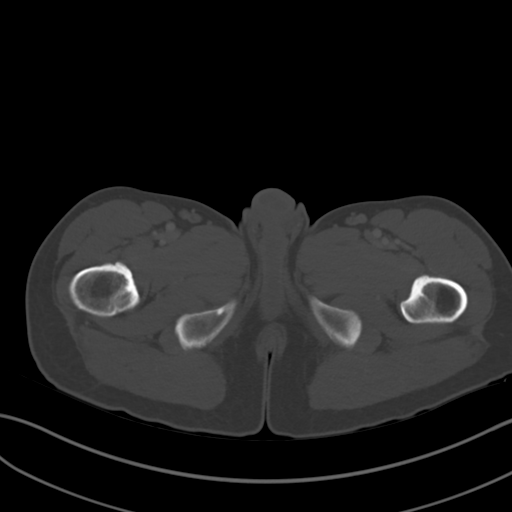
[im 11/87  soft-tissue]
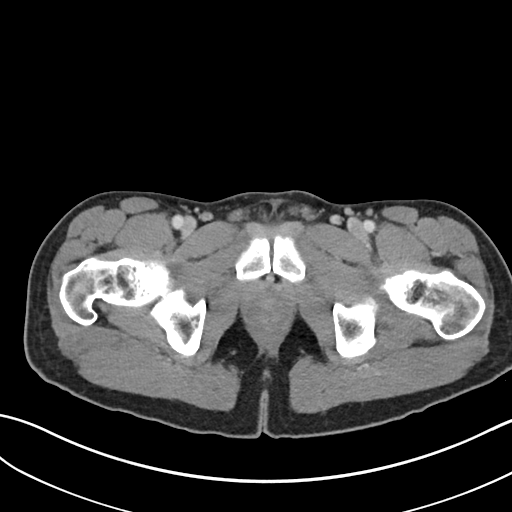
[im 18/87  soft-tissue]
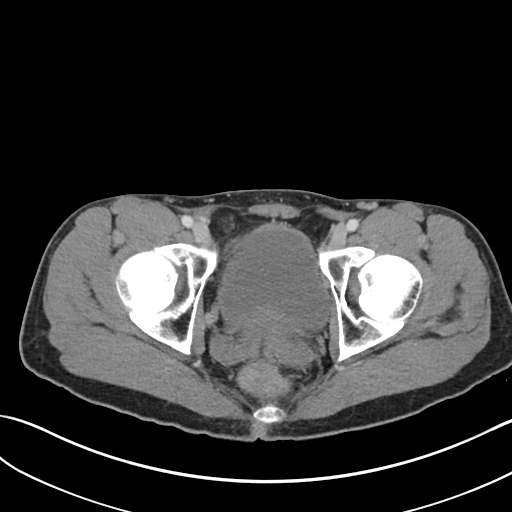
[im 25/87  soft-tissue]
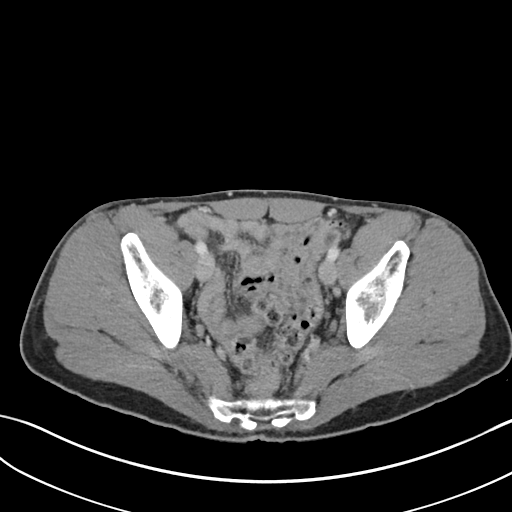
[im 31/87  soft-tissue]
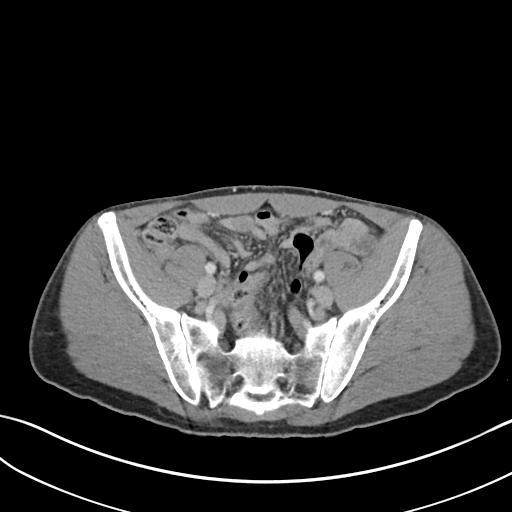
[im 38/87  soft-tissue]
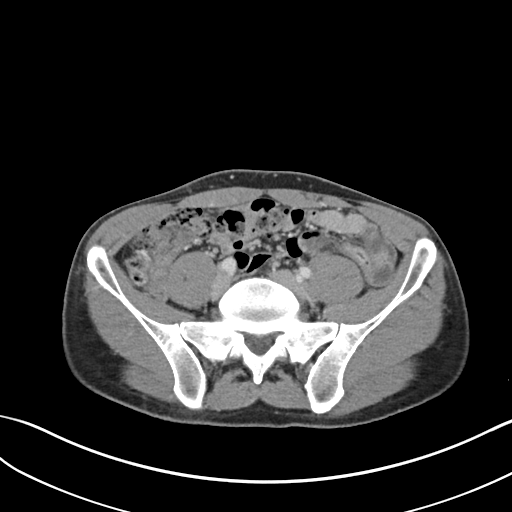
[im 45/87  soft-tissue]
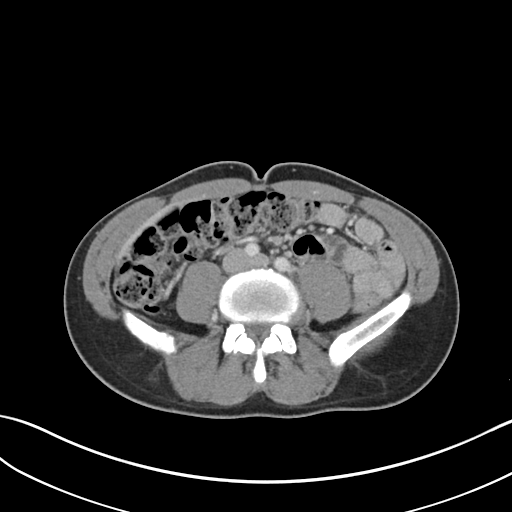
[im 49/87  soft-tissue]
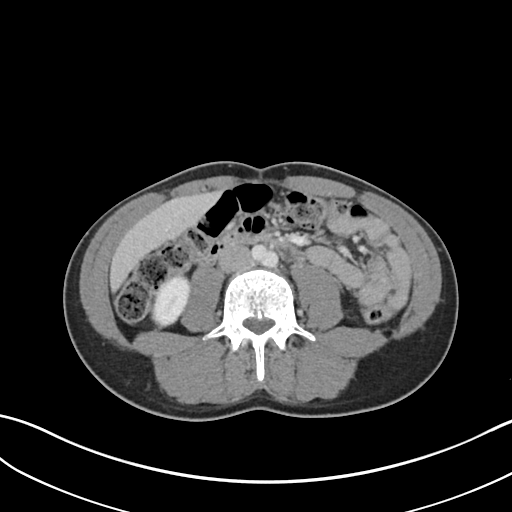
[im 56/87  soft-tissue]
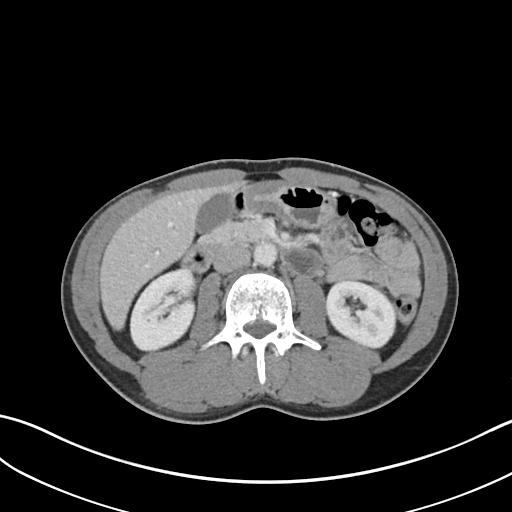
[im 56/87  bone]
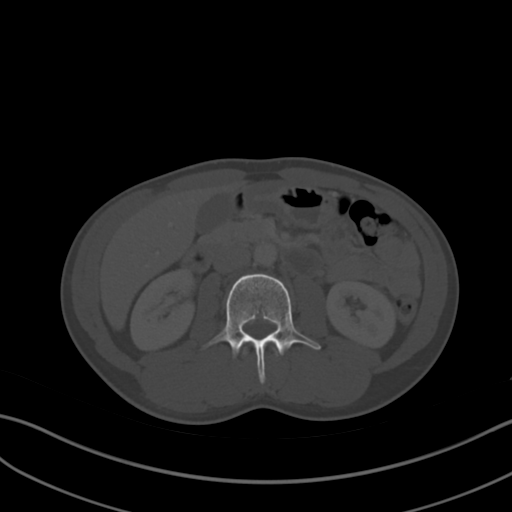
[im 62/87  soft-tissue]
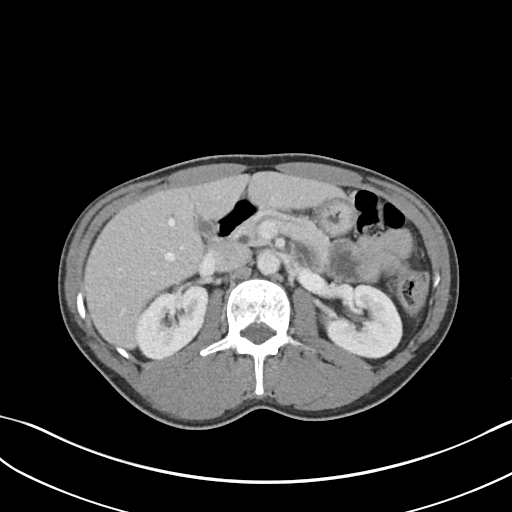
[im 69/87  soft-tissue]
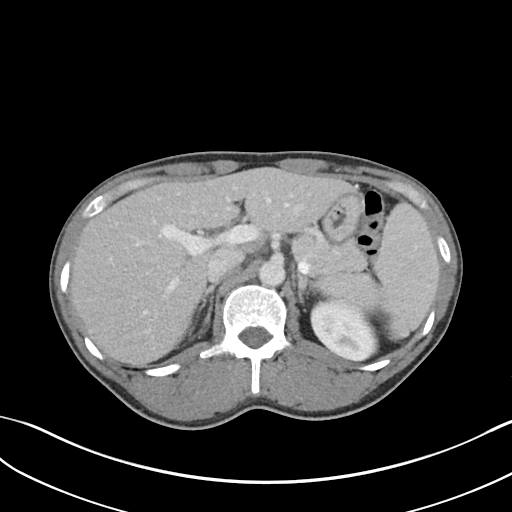
[im 76/87  soft-tissue]
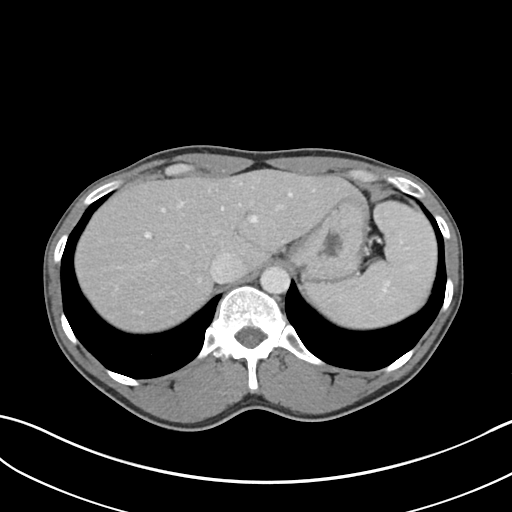
[im 83/87  soft-tissue]
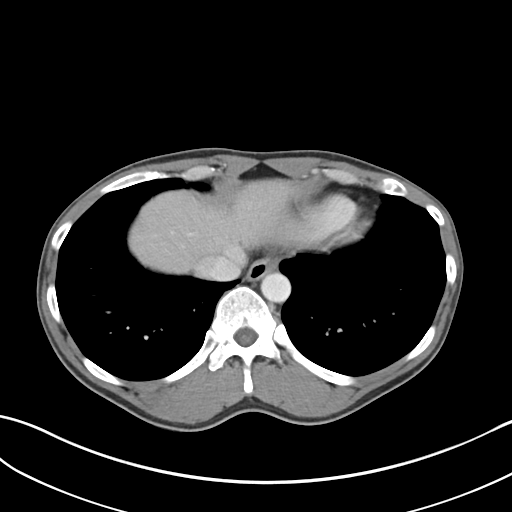

[Series 7: coronal soft tissue · coronal · 0.67mm/px · 3 of 74 slices shown]
[im 25/74  soft-tissue]
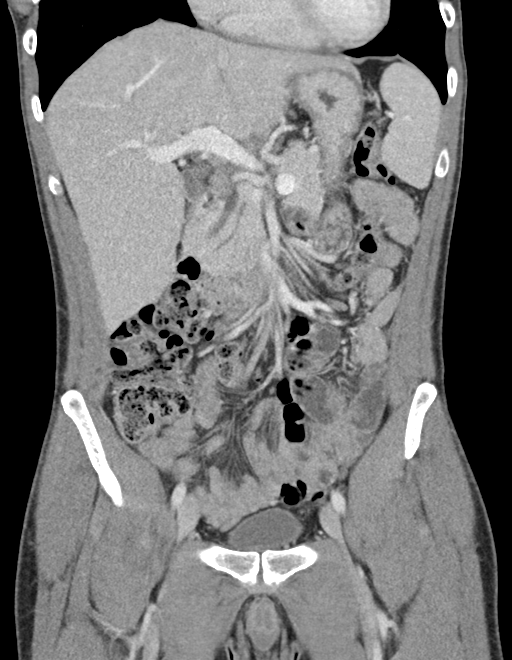
[im 33/74  soft-tissue]
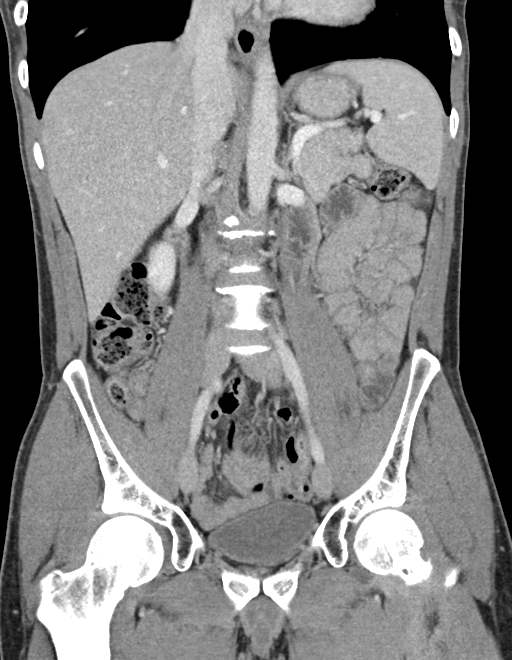
[im 41/74  soft-tissue]
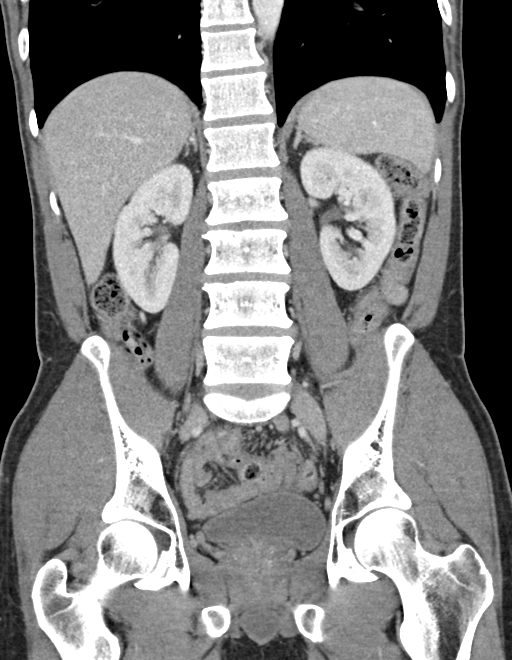

[16 of 46 positions shown; findings below may reference images not displayed]

FINDINGS: Lower chest: Lung bases demonstrate no acute consolidation or
pleural effusion. The heart size is within normal limits.

Hepatobiliary: No focal liver abnormality is seen. No gallstones,
gallbladder wall thickening, or biliary dilatation.

Pancreas: Unremarkable. No pancreatic ductal dilatation or
surrounding inflammatory changes.

Spleen: Normal in size without focal abnormality.

Adrenals/Urinary Tract: Adrenal glands are unremarkable. Kidneys are
normal, without renal calculi, focal lesion, or hydronephrosis.
Bladder is unremarkable.

Stomach/Bowel: Stomach is within normal limits. Appendix appears
normal. No evidence of bowel wall thickening, distention, or
inflammatory changes.

Vascular/Lymphatic: No significant vascular findings are present. No
enlarged abdominal or pelvic lymph nodes.

Reproductive: Prostate slightly enlarged and contains coarse
calcification.

Other: No free air.  Trace fluid in the pelvis.

Musculoskeletal: No acute or significant osseous findings.
IMPRESSION: 1. Negative for acute appendicitis. No right lower quadrant
inflammatory process is visualized.
2. Slightly enlarged prostate gland with calcification, consider
correlation with PSA

## 2019-04-23 ENCOUNTER — Telehealth: Payer: Self-pay | Admitting: Physician Assistant

## 2019-04-23 DIAGNOSIS — Z20822 Contact with and (suspected) exposure to covid-19: Secondary | ICD-10-CM

## 2019-04-23 MED ORDER — ONDANSETRON 4 MG PO TBDP: ORAL_TABLET | ORAL | 0 refills | Status: DC

## 2019-04-23 MED ORDER — BENZONATATE 100 MG PO CAPS: 100.0000 mg | ORAL_CAPSULE | Freq: Three times a day (TID) | ORAL | 0 refills | Status: DC | PRN

## 2019-04-23 NOTE — Progress Notes (Signed)
E-Visit for Corona Virus Screening    I am sorry to hear that you are not feeling well.  We will provide symptomatic treatment for your symptoms to include cough medication and Zofran for vomiting.  If at any point you develop shortness of breath or worsening of your symptoms it is important that you be seen face-to-face by a primary care provider or an urgent care for further evaluation.  Your current symptoms could be consistent with the coronavirus.  Many health care providers can now test patients at their office but not all are.  [place] has multiple testing sites. For information on our COVID testing locations and hours go to [email or web address]  We are enrolling you in our MyChart Home Monitoring for COVID19 . Daily you will receive a questionnaire within the MyChart website. Our COVID 19 response team will be monitoring your responses daily.  Testing Information: The COVID-19 Community Testing sites will begin testing BY APPOINTMENT ONLY.  You can schedule online at [email or web address]  If you do not have access to a smart phone or computer you may call [phone] for an appointment.  Testing Locations: Appointment schedule is 8 am to 3:30 pm at all sites  [facility] indoors at [address] [place] [address] [facility]  indoors at [facility] [address], [place], [place] [address] [place] indoors at [address], [place] [address]  Additional testing sites in the Community:  . For [facility] Testing sites in [facility]  [email or web address].[email or web address]  . For Pop-up testing sites in [place]  [email or web address]  . For Testing sites with regular hours [email or web address]  . For [facility] [email or web address]  . For [facility]  [email or web address].dk  . For [facility] testing in [place] and [employer] [email or web address]  . For [facility] testing in [facility]   [email or web address]  For  more information about community testing call [phone]   We are enrolling you in our MyChart Home Monitoring for COVID19 . Daily you will receive a questionnaire within the MyChart website. Our COVID 19 response team will be monitoring your responses daily.  Please quarantine yourself while awaiting your test results. If you develop fever/cough/breathlessness, please stay home for 10 days with improving symptoms and until you have had 24 hours of no fever (without taking a fever reducer).  You should wear a mask or cloth face covering over your nose and mouth if you must be around other people or animals, including pets (even at home). Try to stay at least 6 feet away from other people. This will protect the people around you.  Please continue good preventive care measures, including:  frequent hand-washing, avoid touching your face, cover coughs/sneezes, stay out of crowds and keep a 6 foot distance from others.  COVID-19 is a respiratory illness with symptoms that are similar to the flu. Symptoms are typically mild to moderate, but there have been cases of severe illness and death due to the virus.   The following symptoms may appear 2-14 days after exposure: . Fever . Cough . Shortness of breath or difficulty breathing . Chills . Repeated shaking with chills . Muscle pain . Headache . Sore throat . New loss of taste or smell . Fatigue . Congestion or runny nose . Nausea or vomiting . Diarrhea  Go to the nearest hospital ED for assessment if fever/cough/breathlessness are severe or illness seems like a threat to  life.  It is vitally important that if you feel  that you have an infection such as this virus or any other virus that you stay home and away from places where you may spread it to others.  You should avoid contact with people age 56 and older.   You can use medication such as A prescription cough medication called Tessalon Perles 100 mg. You may take 1-2 capsules every 8 hours as needed for cough  You may also take acetaminophen (Tylenol) as needed for fever.  Reduce your risk of any infection by using the same precautions used for avoiding the common cold or flu:  Marland Kitchen Wash your hands often with soap and warm water for at least 20 seconds.  If soap and water are not readily available, use an alcohol-based hand sanitizer with at least 60% alcohol.  . If coughing or sneezing, cover your mouth and nose by coughing or sneezing into the elbow areas of your shirt or coat, into a tissue or into your sleeve (not your hands). . Avoid shaking hands with others and consider head nods or verbal greetings only. . Avoid touching your eyes, nose, or mouth with unwashed hands.  . Avoid close contact with people who are sick. . Avoid places or events with large numbers of people in one location, like concerts or sporting events. . Carefully consider travel plans you have or are making. . If you are planning any travel outside or inside the [place], visit the CDC's Travelers' Health webpage for the latest health notices. . If you have some symptoms but not all symptoms, continue to monitor at home and seek medical attention if your symptoms worsen. . If you are having a medical emergency, call 911.  HOME CARE . Only take medications as instructed by your medical team. . Drink plenty of fluids and get plenty of rest. . A steam or ultrasonic humidifier can help if you have congestion.   GET HELP RIGHT AWAY IF YOU HAVE EMERGENCY WARNING SIGNS** FOR COVID-19. If you or someone is showing any of these signs seek emergency  medical care immediately. Call 911 or proceed to your closest emergency facility if: . You develop worsening high fever. . Trouble breathing . Bluish lips or face . Persistent pain or pressure in the chest . New confusion . Inability to wake or stay awake . You cough up blood. . Your symptoms become more severe  **This list is not all possible symptoms. Contact your medical provider for any symptoms that are sever or concerning to you.  MAKE SURE YOU   Understand these instructions.  Will watch your condition.  Will get help right away if you are not doing well or get worse.  Your e-visit answers were reviewed by a board certified advanced clinical practitioner to complete your personal care plan.  Depending on the condition, your plan could have included both over the counter or prescription medications.  If there is a problem please reply once you have received a response from your provider.  Your safety is important to [place].  If you have drug allergies check your prescription carefully.    You can use MyChart to ask questions about today's visit, request a non-urgent call back, or ask for a work or school excuse for 24 hours related to this e-Visit. If it has been greater than 24 hours you will need to follow up with your provider, or enter a new e-Visit to address those concerns. You will get an e-mail in the next two days asking about your experience.  I hope that your e-visit has been valuable and will speed your recovery. Thank you for using e-visits.   Greater than 5 minutes, yet less than 10 minutes of time have been spent researching, coordinating, and implementing care for this patient today

## 2019-07-30 ENCOUNTER — Ambulatory Visit: Payer: Medicaid Other | Attending: Internal Medicine

## 2019-07-30 DIAGNOSIS — Z23 Encounter for immunization: Secondary | ICD-10-CM

## 2019-07-30 NOTE — Progress Notes (Signed)
   Covid-19 Vaccination Clinic  Name:  Taquan Bralley    MRN: [id number] DOB: [birth year]  07/30/2019  Mr. Fiero was observed post Covid-19 immunization for 15 minutes without incident. He was provided with Vaccine Information Sheet and instruction to access the V-Safe system.   Mr. Mohler was instructed to call 911 with any severe reactions post vaccine: Marland Kitchen Difficulty breathing  . Swelling of face and throat  . A fast heartbeat  . A bad rash all over body  . Dizziness and weakness   Immunizations Administered    Name Date Dose VIS Date Route   Pfizer COVID-19 Vaccine 07/30/2019 11:11 AM 0.3 mL 04/16/2019 Intramuscular   Manufacturer: [employer], [employer]   Lot: [id number]   NDC: [address]-8

## 2019-08-24 ENCOUNTER — Ambulatory Visit: Payer: Medicaid Other | Attending: Internal Medicine

## 2019-08-24 DIAGNOSIS — Z23 Encounter for immunization: Secondary | ICD-10-CM

## 2019-08-24 NOTE — Progress Notes (Signed)
   Covid-19 Vaccination Clinic  Name:  Jesse Long    MRN: [id number] DOB: 1980-05-13  08/24/2019  [name] was observed post Covid-19 immunization for 15 minutes without incident. He was provided with Vaccine Information Sheet and instruction to access the V-Safe system.   [name] was instructed to call 911 with any severe reactions post vaccine: Marland Kitchen Difficulty breathing  . Swelling of face and throat  . A fast heartbeat  . A bad rash all over body  . Dizziness and weakness   Immunizations Administered    Name Date Dose VIS Date Route   Pfizer COVID-19 Vaccine 08/24/2019  9:13 AM 0.3 mL 06/30/2018 Intramuscular   Manufacturer: [employer], [employer]   Lot: [id number]   NDC: [address]-2

## 2020-05-02 ENCOUNTER — Ambulatory Visit: Payer: Medicaid Other | Attending: Internal Medicine

## 2020-05-02 DIAGNOSIS — Z23 Encounter for immunization: Secondary | ICD-10-CM

## 2020-05-02 NOTE — Progress Notes (Signed)
   Covid-19 Vaccination Clinic  Name:  Vinicius Brockman    MRN: [id number] DOB: January 17, 1981  05/02/2020  Mr. Brenneman was observed post Covid-19 immunization for 15 minutes without incident. He was provided with Vaccine Information Sheet and instruction to access the V-Safe system.   Mr. Belote was instructed to call 911 with any severe reactions post vaccine: Marland Kitchen Difficulty breathing  . Swelling of face and throat  . A fast heartbeat  . A bad rash all over body  . Dizziness and weakness   Immunizations Administered    Name Date Dose VIS Date Route   Pfizer COVID-19 Vaccine 05/02/2020  2:41 PM 0.3 mL 02/23/2020 Intramuscular   Manufacturer: [employer], [employer]   Lot: [id number]   NDC: [address]-3

## 2021-04-24 ENCOUNTER — Ambulatory Visit: Payer: Self-pay | Admitting: General Surgery

## 2021-04-24 NOTE — H&P (Signed)
PROVIDER:  Elenora Gamma, MD  MRN: [id number] DOB: 07-21-1980 DATE OF ENCOUNTER: 04/24/2021 Subjective   Chief Complaint: No chief complaint on file.     History of Present Illness: Jesse Long is a 40 y.o. 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 Patient states that he has had a longstanding opening over his tailbone.  Over the past 6 to 7 months he has developed discomfort and drainage.  Patient was seen by his primary care physician and referred to our practice for surgical management.  Patient has had no prior procedures.  He uses a vape regularly.   Past Medical History:  Diagnosis Date   Anxiety 2000   anxiety with anger issues. since HS.    Depression 2000   since HS. no SI. just nothing makes you happy    No pertinent past medical history    Palpitations    Panic attack 2016   Polysubstance abuse (HCC) 05/2013   cocaine tried the night before first panic attack, weed in the past    Past Surgical History:  Procedure Laterality Date   I & D EXTREMITY  03/08/2012   Procedure: IRRIGATION AND DEBRIDEMENT EXTREMITY;  Surgeon: Velna Ochs, MD;  Location: [facility] OR;  Service: Orthopedics;  Laterality: Left;   leg  2013   surgery to remove forigen body in left leg    Family History  Adopted: Yes   Social History   Socioeconomic History   Marital status: Single    Spouse name: Not on file   Number of children: 0   Years of education: college    Highest education level: Not on file  Occupational History   Occupation: Chef     Comment: [employer]   Tobacco Use   Smoking status: Former    Packs/day: 0.25    Types: Cigarettes    Quit date: 08/11/2013    Years since quitting: 7.7   Smokeless tobacco: Never  Vaping Use   Vaping Use: Every day  Substance and Sexual Activity   Alcohol use: No    Alcohol/week: 0.0 standard drinks   Drug use: Yes    Types: Oxycodone   Sexual activity: Not Currently    Partners: Female    Comment: last drug used Jan  1,2016  Other Topics Concern   Not on file  Social History Narrative   Lives with brother (not a blood relative).    Works at [employer] as a Lawyer Strain: Not on [employer] Insecurity: Not on file  Transportation Needs: Not on file  Physical Activity: Not on file  Stress: Not on file  Social Connections: Not on file  Intimate Partner Violence: Not on file   Outpatient Encounter Medications as of 04/24/2021  Medication Sig   acetaminophen (TYLENOL) 325 MG tablet Take 650 mg by mouth every 6 (six) hours as needed for headache.   benzonatate (TESSALON PERLES) 100 MG capsule Take 1 capsule (100 mg total) by mouth 3 (three) times daily as needed for cough (cough).   ergocalciferol (DRISDOL) 50000 units capsule Take 1 capsule (50,000 Units total) by mouth once a week.   gabapentin (NEURONTIN) 300 MG capsule Take 1 capsule (300 mg total) by mouth 2 (two) times daily for 7 days, THEN 1 capsule (300 mg total) 3 (three) times daily for 23 days.   naproxen (NAPROSYN) 500 MG tablet Take 1 tablet (500 mg total) by mouth 2 (two)  times daily with a meal.   ondansetron (ZOFRAN ODT) 4 MG disintegrating tablet 4mg  ODT q4 hours prn nausea/vomit   tamsulosin (FLOMAX) 0.4 MG CAPS capsule Take 1 capsule (0.4 mg total) by mouth daily.   traMADol (ULTRAM) 50 MG tablet TAKE 1 TABLET BY MOUTH EVERY 6 HOURS AS NEEDED FOR 7 DAYS   No facility-administered encounter medications on file as of 04/24/2021.   No Known Allergies  Review of Systems - Negative except as stated in HPI  Objective:    General appearance - alert, well appearing, and in no distress Chest - clear to auscultation, no wheezes, rales or rhonchi, symmetric air entry Heart - normal rate, regular rhythm, normal S1, S2, no murmurs, rubs, clicks or gallops Abdomen - soft, nontender, nondistended, no masses or organomegaly Rectal - Pilonidal pit at midline.  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  We discussed the typical surgical procedure.  We discussed the need for drain placement and removal 1 week later.  We discussed limiting activities and using his vape pen for 3 weeks after surgery.  We discussed the typical healing rates and recurrence rates.  We discussed the typical postoperative pain.  All questions were answered.

## 2021-06-25 ENCOUNTER — Encounter (HOSPITAL_BASED_OUTPATIENT_CLINIC_OR_DEPARTMENT_OTHER): Payer: Self-pay | Admitting: General Surgery

## 2021-06-25 ENCOUNTER — Other Ambulatory Visit: Payer: Self-pay

## 2021-06-25 NOTE — Progress Notes (Signed)
Spoke w/ via phone for pre-op interview--- pt Lab needs dos----    no           Lab results------ no COVID test -----patient states asymptomatic no test needed Arrive at ------- 0530 on 06-28-2021 NPO after MN NO Solid Food.  Clear liquids from MN until--- 0430 Med rec completed Medications to take morning of surgery ----- methadone Diabetic medication ----- n/a Patient instructed no nail polish to be worn day of surgery Patient instructed to bring photo id and insurance card day of surgery Patient aware to have Driver (ride ) / caregiver for 24 hours after surgery --mother, deena hayes Patient Special Instructions ----- n/a Pre-Op special Istructions ----- n/a Patient verbalized understanding of instructions that were given at this phone interview. Patient denies shortness of breath, chest pain, fever, cough at this phone interview.

## 2021-06-28 ENCOUNTER — Ambulatory Visit (HOSPITAL_BASED_OUTPATIENT_CLINIC_OR_DEPARTMENT_OTHER): Payer: Managed Care, Other (non HMO) | Admitting: Anesthesiology

## 2021-06-28 ENCOUNTER — Ambulatory Visit (HOSPITAL_BASED_OUTPATIENT_CLINIC_OR_DEPARTMENT_OTHER)
Admission: RE | Admit: 2021-06-28 | Discharge: 2021-06-28 | Disposition: A | Discharge status: Home / Self Care | Payer: Managed Care, Other (non HMO) | Source: Ambulatory Visit | Attending: General Surgery | Admitting: General Surgery

## 2021-06-28 ENCOUNTER — Encounter (HOSPITAL_BASED_OUTPATIENT_CLINIC_OR_DEPARTMENT_OTHER): Payer: Self-pay | Admitting: General Surgery

## 2021-06-28 ENCOUNTER — Encounter (HOSPITAL_BASED_OUTPATIENT_CLINIC_OR_DEPARTMENT_OTHER)
Admission: RE | Disposition: A | Discharge status: Home / Self Care | Payer: Self-pay | Source: Ambulatory Visit | Attending: General Surgery

## 2021-06-28 ENCOUNTER — Other Ambulatory Visit: Payer: Self-pay

## 2021-06-28 DIAGNOSIS — F17290 Nicotine dependence, other tobacco product, uncomplicated: Secondary | ICD-10-CM | POA: Diagnosis not present

## 2021-06-28 DIAGNOSIS — F418 Other specified anxiety disorders: Secondary | ICD-10-CM | POA: Diagnosis not present

## 2021-06-28 DIAGNOSIS — L0592 Pilonidal sinus without abscess: Secondary | ICD-10-CM | POA: Insufficient documentation

## 2021-06-28 HISTORY — DX: Personal history of other specified conditions: Z87.898

## 2021-06-28 HISTORY — DX: Personal history of other diseases of the circulatory system: Z86.79

## 2021-06-28 HISTORY — DX: Presence of spectacles and contact lenses: Z97.3

## 2021-06-28 HISTORY — DX: Other specified disorders of the skin and subcutaneous tissue: L98.8

## 2021-06-28 HISTORY — DX: Personal history of other mental and behavioral disorders: Z86.59

## 2021-06-28 HISTORY — DX: Opioid abuse, in remission: F11.11

## 2021-06-28 HISTORY — DX: Benign prostatic hyperplasia with lower urinary tract symptoms: N40.1

## 2021-06-28 HISTORY — PX: MASS EXCISION: SHX2000

## 2021-06-28 SURGERY — EXCISION MASS
Anesthesia: Monitor Anesthesia Care | Site: Rectum | Laterality: Right

## 2021-06-28 MED ORDER — ACETAMINOPHEN 325 MG RE SUPP: 650.0000 mg | RECTAL | Status: DC | PRN

## 2021-06-28 MED ORDER — ONDANSETRON HCL 4 MG/2ML IJ SOLN: 4.0000 mg | Freq: Once | INTRAMUSCULAR | Status: DC | PRN

## 2021-06-28 MED ORDER — MIDAZOLAM HCL 2 MG/2ML IJ SOLN
INTRAMUSCULAR | Status: AC
  Filled 2021-06-28: qty 2

## 2021-06-28 MED ORDER — BUPIVACAINE LIPOSOME 1.3 % IJ SUSP: 20.0000 mL | Freq: Once | INTRAMUSCULAR | Status: DC

## 2021-06-28 MED ORDER — PROPOFOL 10 MG/ML IV BOLUS
INTRAVENOUS | Status: AC
  Filled 2021-06-28: qty 20

## 2021-06-28 MED ORDER — SODIUM CHLORIDE 0.9% FLUSH: 3.0000 mL | Freq: Two times a day (BID) | INTRAVENOUS | Status: DC

## 2021-06-28 MED ORDER — GABAPENTIN 300 MG PO CAPS
300.0000 mg | ORAL_CAPSULE | ORAL | Status: AC
  Administered 2021-06-28: 300 mg via ORAL

## 2021-06-28 MED ORDER — DEXAMETHASONE SODIUM PHOSPHATE 10 MG/ML IJ SOLN
INTRAMUSCULAR | Status: AC
Start: 2021-06-28 — End: ?
  Filled 2021-06-28: qty 1

## 2021-06-28 MED ORDER — ROCURONIUM BROMIDE 10 MG/ML (PF) SYRINGE
PREFILLED_SYRINGE | INTRAVENOUS | Status: AC
Start: 2021-06-28 — End: ?
  Filled 2021-06-28: qty 10

## 2021-06-28 MED ORDER — LIDOCAINE HCL (PF) 2 % IJ SOLN
INTRAMUSCULAR | Status: AC
  Filled 2021-06-28: qty 5

## 2021-06-28 MED ORDER — KETOROLAC TROMETHAMINE 30 MG/ML IJ SOLN
INTRAMUSCULAR | Status: AC
  Filled 2021-06-28: qty 1

## 2021-06-28 MED ORDER — BUPIVACAINE-EPINEPHRINE 0.5% -1:200000 IJ SOLN
INTRAMUSCULAR | Status: DC | PRN
  Administered 2021-06-28: 24 mL

## 2021-06-28 MED ORDER — OXYCODONE HCL 5 MG/5ML PO SOLN: 5.0000 mg | Freq: Once | ORAL | Status: DC | PRN

## 2021-06-28 MED ORDER — ACETAMINOPHEN 500 MG PO TABS
1000.0000 mg | ORAL_TABLET | ORAL | Status: AC
  Administered 2021-06-28: 1000 mg via ORAL

## 2021-06-28 MED ORDER — PROPOFOL 500 MG/50ML IV EMUL
INTRAVENOUS | Status: DC | PRN
  Administered 2021-06-28: 25 ug/kg/min via INTRAVENOUS

## 2021-06-28 MED ORDER — CELECOXIB 200 MG PO CAPS
ORAL_CAPSULE | ORAL | Status: AC
  Filled 2021-06-28: qty 1

## 2021-06-28 MED ORDER — LIDOCAINE HCL (CARDIAC) PF 100 MG/5ML IV SOSY
PREFILLED_SYRINGE | INTRAVENOUS | Status: DC | PRN
Start: 2021-06-28 — End: 2021-06-28
  Administered 2021-06-28: 40 mg via INTRAVENOUS

## 2021-06-28 MED ORDER — ACETAMINOPHEN 325 MG PO TABS: 650.0000 mg | ORAL_TABLET | ORAL | Status: DC | PRN

## 2021-06-28 MED ORDER — CELECOXIB 200 MG PO CAPS
200.0000 mg | ORAL_CAPSULE | ORAL | Status: AC
  Administered 2021-06-28: 200 mg via ORAL

## 2021-06-28 MED ORDER — ONDANSETRON HCL 4 MG/2ML IJ SOLN
INTRAMUSCULAR | Status: AC
Start: 2021-06-28 — End: ?
  Filled 2021-06-28: qty 2

## 2021-06-28 MED ORDER — KETOROLAC TROMETHAMINE 30 MG/ML IJ SOLN
INTRAMUSCULAR | Status: DC | PRN
  Administered 2021-06-28: 30 mg via INTRAVENOUS

## 2021-06-28 MED ORDER — CEFAZOLIN SODIUM-DEXTROSE 2-4 GM/100ML-% IV SOLN
2.0000 g | INTRAVENOUS | Status: AC
  Administered 2021-06-28: 2 g via INTRAVENOUS

## 2021-06-28 MED ORDER — KETAMINE HCL 50 MG/5ML IJ SOSY
PREFILLED_SYRINGE | INTRAMUSCULAR | Status: AC
  Filled 2021-06-28: qty 5

## 2021-06-28 MED ORDER — LACTATED RINGERS IV SOLN: INTRAVENOUS | Status: DC

## 2021-06-28 MED ORDER — OXYCODONE HCL 5 MG PO TABS: 5.0000 mg | ORAL_TABLET | Freq: Four times a day (QID) | ORAL | 0 refills | Status: AC | PRN

## 2021-06-28 MED ORDER — DEXMEDETOMIDINE (PRECEDEX) IN NS 20 MCG/5ML (4 MCG/ML) IV SYRINGE
PREFILLED_SYRINGE | INTRAVENOUS | Status: DC | PRN
  Administered 2021-06-28: 8 ug via INTRAVENOUS

## 2021-06-28 MED ORDER — SODIUM CHLORIDE 0.9 % IV SOLN: 250.0000 mL | INTRAVENOUS | Status: DC | PRN

## 2021-06-28 MED ORDER — AMISULPRIDE (ANTIEMETIC) 5 MG/2ML IV SOLN: 10.0000 mg | Freq: Once | INTRAVENOUS | Status: DC | PRN

## 2021-06-28 MED ORDER — ACETAMINOPHEN 500 MG PO TABS
ORAL_TABLET | ORAL | Status: AC
  Filled 2021-06-28: qty 2

## 2021-06-28 MED ORDER — MIDAZOLAM HCL 5 MG/5ML IJ SOLN
INTRAMUSCULAR | Status: DC | PRN
  Administered 2021-06-28: 2 mg via INTRAVENOUS
  Administered 2021-06-28 (×2): 1 mg via INTRAVENOUS

## 2021-06-28 MED ORDER — KETAMINE HCL 10 MG/ML IJ SOLN
INTRAMUSCULAR | Status: DC | PRN
  Administered 2021-06-28: 20 mg via INTRAVENOUS

## 2021-06-28 MED ORDER — FENTANYL CITRATE (PF) 100 MCG/2ML IJ SOLN
INTRAMUSCULAR | Status: AC
  Filled 2021-06-28: qty 2

## 2021-06-28 MED ORDER — FENTANYL CITRATE (PF) 100 MCG/2ML IJ SOLN: 25.0000 ug | INTRAMUSCULAR | Status: DC | PRN

## 2021-06-28 MED ORDER — PROPOFOL 500 MG/50ML IV EMUL
INTRAVENOUS | Status: AC
  Filled 2021-06-28: qty 50

## 2021-06-28 MED ORDER — OXYCODONE HCL 5 MG PO TABS: 5.0000 mg | ORAL_TABLET | Freq: Once | ORAL | Status: DC | PRN

## 2021-06-28 MED ORDER — OXYCODONE HCL 5 MG PO TABS: 5.0000 mg | ORAL_TABLET | ORAL | Status: DC | PRN

## 2021-06-28 MED ORDER — CEFAZOLIN SODIUM-DEXTROSE 2-4 GM/100ML-% IV SOLN
INTRAVENOUS | Status: AC
  Filled 2021-06-28: qty 100

## 2021-06-28 MED ORDER — SODIUM CHLORIDE 0.9% FLUSH: 3.0000 mL | INTRAVENOUS | Status: DC | PRN

## 2021-06-28 MED ORDER — 0.9 % SODIUM CHLORIDE (POUR BTL) OPTIME
TOPICAL | Status: DC | PRN
  Administered 2021-06-28: 500 mL

## 2021-06-28 MED ORDER — ONDANSETRON HCL 4 MG/2ML IJ SOLN
INTRAMUSCULAR | Status: DC | PRN
  Administered 2021-06-28: 4 mg via INTRAVENOUS

## 2021-06-28 MED ORDER — GABAPENTIN 300 MG PO CAPS
ORAL_CAPSULE | ORAL | Status: AC
  Filled 2021-06-28: qty 1

## 2021-06-28 SURGICAL SUPPLY — 51 items
BENZOIN TINCTURE PRP APPL 2/3 (GAUZE/BANDAGES/DRESSINGS) ×1 IMPLANT
BLADE CLIPPER SENSICLIP SURGIC (BLADE) IMPLANT
BLADE EXTENDED COATED 6.5IN (ELECTRODE) IMPLANT
BLADE SURG 10 STRL SS (BLADE) ×2 IMPLANT
CHLORAPREP W/TINT 26 (MISCELLANEOUS) ×2 IMPLANT
COVER BACK TABLE 60X90IN (DRAPES) ×2 IMPLANT
COVER MAYO STAND STRL (DRAPES) ×2 IMPLANT
DECANTER SPIKE VIAL GLASS SM (MISCELLANEOUS) IMPLANT
DERMABOND ADVANCED (GAUZE/BANDAGES/DRESSINGS) ×1
DERMABOND ADVANCED .7 DNX12 (GAUZE/BANDAGES/DRESSINGS) IMPLANT
DRAIN PENROSE 0.25X18 (DRAIN) ×1 IMPLANT
DRAPE LAPAROTOMY 100X72 PEDS (DRAPES) ×2 IMPLANT
DRAPE UTILITY XL STRL (DRAPES) ×2 IMPLANT
DRSG PAD ABDOMINAL 8X10 ST (GAUZE/BANDAGES/DRESSINGS) ×1 IMPLANT
DRSG TEGADERM 4X4.75 (GAUZE/BANDAGES/DRESSINGS) ×1 IMPLANT
ELECT REM PT RETURN 9FT ADLT (ELECTROSURGICAL) ×2
ELECTRODE REM PT RTRN 9FT ADLT (ELECTROSURGICAL) ×1 IMPLANT
GAUZE 4X4 16PLY ~~LOC~~+RFID DBL (SPONGE) ×2 IMPLANT
GAUZE SPONGE 4X4 12PLY STRL (GAUZE/BANDAGES/DRESSINGS) ×2 IMPLANT
GLOVE SURG ENC MOIS LTX SZ6.5 (GLOVE) ×4 IMPLANT
GLOVE SURG UNDER LTX SZ6.5 (GLOVE) ×2 IMPLANT
GLOVE SURG UNDER POLY LF SZ7 (GLOVE) ×1 IMPLANT
GLOVE SURG UNDER POLY LF SZ7.5 (GLOVE) ×1 IMPLANT
GLOVE SURG UNDER POLY LF SZ8.5 (GLOVE) ×2 IMPLANT
GOWN STRL REUS W/TWL XL LVL3 (GOWN DISPOSABLE) ×2 IMPLANT
GOWN SURG REUSE MED LVL4 (GOWN DISPOSABLE) ×1 IMPLANT
KIT TURNOVER CYSTO (KITS) ×2 IMPLANT
NEEDLE HYPO 22GX1.5 SAFETY (NEEDLE) ×2 IMPLANT
NS IRRIG 500ML POUR BTL (IV SOLUTION) IMPLANT
PACK BASIN DAY SURGERY FS (CUSTOM PROCEDURE TRAY) ×2 IMPLANT
PAD ARMBOARD 7.5X6 YLW CONV (MISCELLANEOUS) IMPLANT
PENCIL SMOKE EVACUATOR (MISCELLANEOUS) ×2 IMPLANT
STRIP CLOSURE SKIN 1/2X4 (GAUZE/BANDAGES/DRESSINGS) IMPLANT
SUT ETHILON 2 0 FS 18 (SUTURE) ×1 IMPLANT
SUT ETHILON 4 0 PS 2 18 (SUTURE) IMPLANT
SUT SILK 2 0 SH (SUTURE) IMPLANT
SUT VIC AB 2-0 SH 27 (SUTURE)
SUT VIC AB 2-0 SH 27XBRD (SUTURE) IMPLANT
SUT VIC AB 3-0 SH 18 (SUTURE) IMPLANT
SUT VIC AB 4-0 PS2 18 (SUTURE) ×1 IMPLANT
SUT VIC AB 4-0 SH 18 (SUTURE) IMPLANT
SWAB CULTURE ESWAB REG 1ML (MISCELLANEOUS) IMPLANT
SYR BULB IRRIG 60ML STRL (SYRINGE) ×2 IMPLANT
SYR CONTROL 10ML LL (SYRINGE) ×2 IMPLANT
TAPE PAPER 3X10 WHT MICROPORE (GAUZE/BANDAGES/DRESSINGS) ×1 IMPLANT
TOWEL OR 17X26 10 PK STRL BLUE (TOWEL DISPOSABLE) ×2 IMPLANT
TRAY DSU PREP LF (CUSTOM PROCEDURE TRAY) IMPLANT
TUBE CONNECTING 12X1/4 (SUCTIONS) ×2 IMPLANT
UNDERPAD 30X36 HEAVY ABSORB (UNDERPADS AND DIAPERS) IMPLANT
WATER STERILE IRR 500ML POUR (IV SOLUTION) ×2 IMPLANT
YANKAUER SUCT BULB TIP NO VENT (SUCTIONS) ×2 IMPLANT

## 2021-06-28 NOTE — H&P (Signed)
PROVIDER:  Elenora Gamma, MD   MRN: [id number] DOB: [birth year]  Subjective    Chief Complaint: No chief complaint on file.       History of Present Illness: Jesse Long is a 41 y.o. 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 Patient states that he has had a longstanding opening over his tailbone.  Over the past 6 to 7 months he has developed discomfort and drainage.  Patient was seen by his primary care physician and referred to our practice for surgical management.  Patient has had no prior procedures.  He uses a vape regularly.     Past Medical History:  Diagnosis Date   Benign localized prostatic hyperplasia with lower urinary tract symptoms (LUTS)    History of heart murmur in childhood    History of heroin abuse (HCC)    20-20-2023  pt stated has used 2019  currently taking methadone   History of palpitations    History of panic attacks    Pilonidal disease    Wears glasses    Past Surgical History:  Procedure Laterality Date   I & D EXTREMITY  03/08/2012   Procedure: IRRIGATION AND DEBRIDEMENT EXTREMITY;  Surgeon: Velna Ochs, MD;  Location: [facility] OR;  Service: Orthopedics;  Laterality: Left;   Social History   Socioeconomic History   Marital status: Single    Spouse name: Not on file   Number of children: 0   Years of education: college    Highest education level: Not on file  Occupational History   Occupation: Chef     Comment: [employer]   Tobacco Use   Smoking status: Former    Years: 2.00    Types: Cigarettes    Quit date: 2015    Years since quitting: 8.1   Smokeless tobacco: Never  Vaping Use   Vaping Use: Every day   Substances: Nicotine-salt   Devices: unsure  Substance and Sexual Activity   Alcohol use: Not Currently    Comment: none since 2019   Drug use: Not Currently    Types: Heroin    Comment: 06-25-2021  hx heroin use,  pt stated none since 10/ 2019   Sexual activity: Not on file  Other Topics Concern   Not on file   Social History Narrative   Lives with brother (not a blood relative).    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    NKDA  Review of Systems - Negative except as stated above      Objective:      Ht 6\' 2"  (1.88 m)    Wt 81.6 kg    BMI 23.11 kg/m       General appearance - alert, well appearing, and in no distress Chest - clear to auscultation, no wheezes, rales or rhonchi, symmetric air entry Heart - normal rate, regular rhythm, normal S1, S2, no murmurs, rubs, clicks or gallops Abdomen - soft, nontender, nondistended, no masses or organomegaly Rectal - Pilonidal pit at midline.  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  We discussed the typical surgical procedure.  We discussed the need for drain placement and removal 1 week later.  We discussed limiting activities and limit his vape pen for 3 weeks after surgery.  We discussed the typical healing rates and recurrence rates.  We discussed the typical postoperative pain.  All questions were answered.   No follow-ups on file.       Jesse Panda, MD Colon and Rectal Surgery [facility]

## 2021-06-28 NOTE — Discharge Instructions (Addendum)
GENERAL SURGERY: POST OP INSTRUCTIONS  DIET: Follow a light bland diet the first 24 hours after arrival home, such as soup, liquids, crackers, etc.  Be sure to include lots of fluids daily.  Avoid fast food or heavy meals as your are more likely to get nauseated.   Take your usually prescribed home medications unless otherwise directed. PAIN CONTROL: Pain is best controlled by a usual combination of three different methods TOGETHER: Ice/Heat Over the counter pain medication Prescription pain medication Most patients will experience some swelling and bruising around the incisions.  Ice packs or heating pads (30-60 minutes up to 6 times a day) will help. Use ice for the first few days to help decrease swelling and bruising, then switch to heat to help relax tight/sore spots and speed recovery.  Some people prefer to use ice alone, heat alone, alternating between ice & heat.  Experiment to what works for you.  Swelling and bruising can take several weeks to resolve.   It is helpful to take an over-the-counter pain medication regularly for the first few weeks.  Choose one of the following that works best for you: Naproxen (Aleve, etc)  Two 220mg  tabs twice a day Ibuprofen (Advil, etc) Three 200mg  tabs four times a day (every meal & bedtime) A  prescription for pain medication (such as Percocet, oxycodone, hydrocodone, etc) should be given to you upon discharge.  Take your pain medication as prescribed.  If you are having problems/concerns with the prescription medicine (does not control pain, nausea, vomiting, rash, itching, etc), please call us [phone] to see if we need to switch you to a different pain medicine that will work better for you and/or control your side effect better. If you need a refill on your pain medication, please contact your pharmacy.  They will contact our office to request authorization. Prescriptions will not be filled after 5 pm or on week-ends. Avoid getting constipated.   Between the surgery and the pain medications, it is common to experience some constipation.  Increasing fluid intake and taking a fiber supplement (such as Metamucil, Citrucel, FiberCon, MiraLax, etc) 1-2 times a day regularly will usually help prevent this problem from occurring.  A mild laxative (prune juice, Milk of Magnesia, MiraLax, etc) should be taken according to package directions if there are no bowel movements after 48 hours.   Wash / shower every day.  You may remove the dressings and shower over the wounds  Remove your dressing the day after surgery and clean the wounds with soap and water.  Replace with a pad or dressing.  Change more frequently if needed.    ACTIVITIES as tolerated:   You may resume regular (light) daily activities beginning the next day--such as daily self-care, walking, climbing stairs--gradually increasing activities as tolerated.  If you can walk 30 minutes without difficulty, it is safe to try more intense activity such as jogging, treadmill, bicycling, low-impact aerobics, swimming, etc. Save the most intensive and strenuous activity for last such as sit-ups, heavy lifting, contact sports, etc  Refrain from any heavy lifting or straining until you are off narcotics for pain control.   DO NOT PUSH THROUGH PAIN.  Let pain be your guide: If it hurts to do something, don't do it.  Pain is your body warning you to avoid that activity for another week until the pain goes down. You may drive when you are no longer taking prescription pain medication, you can comfortably wear a seatbelt, and you can  safely maneuver your car and apply brakes. You may have sexual intercourse when it is comfortable.  FOLLOW UP in our office Please call [facility] at [phone] to set up an appointment to see your surgeon in the office for a follow-up appointment approximately 2-3 weeks after your surgery. Make sure that you call for this appointment the day you arrive home to insure a convenient  appointment time. 9. IF YOU HAVE DISABILITY OR FAMILY LEAVE FORMS, BRING THEM TO THE OFFICE FOR PROCESSING.  DO NOT GIVE THEM TO YOUR DOCTOR.   WHEN TO CALL us [phone]: Poor pain control Reactions / problems with new medications (rash/itching, nausea, etc)  Fever over 101.5 F (38.5 C) Worsening swelling or bruising Continued bleeding from incision. Increased pain, redness, or drainage from the incision   The clinic staff is available to answer your questions during regular business hours (8:30am-5pm).  Please dont hesitate to call and ask to speak to one of our nurses for clinical concerns.   If you have a medical emergency, go to the nearest emergency room or call 911.  A surgeon from [facility] is always on call at the [facility], [place], [place], [place], [place]  [address] ? MAIN: [phone] ? TOLL FREE: [phone] ?  FAX [phone] [email or web address]    Post Anesthesia Home Care Instructions  Activity: Get plenty of rest for the remainder of the day. A responsible individual must stay with you for 24 hours following the procedure.  For the next 24 hours, DO NOT: -Drive a car -Paediatric nurse -Drink alcoholic beverages -Take any medication unless instructed by your physician -Make any legal decisions or sign important papers.  Meals: Start with liquid foods such as gelatin or soup. Progress to regular foods as tolerated. Avoid greasy, spicy, heavy foods. If nausea and/or vomiting occur, drink only clear liquids until the nausea and/or vomiting subsides. Call your physician if vomiting continues.  Special Instructions/Symptoms: Your throat may feel dry or sore from the anesthesia or the breathing tube placed in your throat during surgery. If this causes discomfort, gargle with warm salt water. The discomfort should disappear within 24 hours.  No acetaminophen/Tylenol until after 3:30  pm today if needed. No ibuprofen, Advil, Aleve, Motrin, ketorolac, meloxicam, naproxen, or other NSAIDS until after 3:30 pm today if needed.

## 2021-06-28 NOTE — Anesthesia Preprocedure Evaluation (Addendum)
Anesthesia Evaluation  Patient identified by MRN, date of birth, ID band Patient awake    Reviewed: Allergy & Precautions, NPO status , Patient's Chart, lab work & pertinent test results  History of Anesthesia Complications Negative for: history of anesthetic complications  Airway Mallampati: I  TM Distance: >3 FB Neck ROM: Full    Dental  (+) Teeth Intact, Dental Advisory Given   Pulmonary neg pulmonary ROS, Patient abstained from smoking., former smoker,    Pulmonary exam normal        Cardiovascular negative cardio ROS Normal cardiovascular exam     Neuro/Psych Anxiety Depression negative neurological ROS     GI/Hepatic negative GI ROS, (+)     substance abuse (h/o IVDU, on methadone)  IV drug use,   Endo/Other  negative endocrine ROS  Renal/GU negative Renal ROS  negative genitourinary   Musculoskeletal negative musculoskeletal ROS (+) narcotic dependent  Abdominal   Peds  Hematology negative hematology ROS (+)   Anesthesia Other Findings  Pilonidal fistula  Reproductive/Obstetrics                            Anesthesia Physical Anesthesia Plan  ASA: 2  Anesthesia Plan: MAC   Post-op Pain Management: Tylenol PO (pre-op)*, Gabapentin PO (pre-op)* and Celebrex PO (pre-op)*   Induction: Intravenous  PONV Risk Score and Plan: 1 and Propofol infusion, TIVA and Treatment may vary due to age or medical condition  Airway Management Planned: Natural Airway, Nasal Cannula and Simple Face Mask  Additional Equipment: None  Intra-op Plan:   Post-operative Plan:   Informed Consent: I have reviewed the patients History and Physical, chart, labs and discussed the procedure including the risks, benefits and alternatives for the proposed anesthesia with the patient or authorized representative who has indicated his/her understanding and acceptance.       Plan Discussed with:    Anesthesia Plan Comments:        Anesthesia Quick Evaluation

## 2021-06-28 NOTE — Anesthesia Postprocedure Evaluation (Signed)
Anesthesia Post Note  Patient: Jesse Long  Procedure(s) Performed: EXCISION OF PILONODAL DISEASE (Right: Rectum)     Patient location during evaluation: PACU Anesthesia Type: MAC Level of consciousness: awake and alert Pain management: pain level controlled Vital Signs Assessment: post-procedure vital signs reviewed and stable Respiratory status: spontaneous breathing, nonlabored ventilation and respiratory function stable Cardiovascular status: blood pressure returned to baseline and stable Postop Assessment: no apparent nausea or vomiting Anesthetic complications: no   No notable events documented.  Last Vitals:  Vitals:   06/28/21 1200 06/28/21 1259  BP: 95/66 102/70  Pulse: 72 67  Resp: 15 14  Temp: (!) 36.3 C 36.4 C  SpO2: 96% 100%    Last Pain:  Vitals:   06/28/21 1259  TempSrc:   PainSc: 0-No pain                 Lidia Collum

## 2021-06-28 NOTE — Transfer of Care (Signed)
Immediate Anesthesia Transfer of Care Note  Patient: Jesse Long  Procedure(s) Performed: Procedure(s) (LRB): EXCISION OF PILONODAL DISEASE (Right)  Patient Location: PACU  Anesthesia Type: MAC  Level of Consciousness: awake, sedated, patient cooperative and responds to stimulation  Airway & Oxygen Therapy: Patient Spontanous Breathing and Patient connected to [place] 02 and soft FM   Post-op Assessment: Report given to PACU RN, Post -op Vital signs reviewed and stable and Patient moving all extremities  Post vital signs: Reviewed and stable  Complications: No apparent anesthesia complications

## 2021-06-28 NOTE — Op Note (Signed)
06/28/2021  11:15 AM  PATIENT:  Jesse Long  41 y.o. male  Patient Care Team: Lanae Boast, [place] (Inactive) as PCP - General (Family Medicine)  PRE-OPERATIVE DIAGNOSIS:  PILONIDAL FISTULA  POST-OPERATIVE DIAGNOSIS:  PILONIDAL FISTULA  PROCEDURE:   EXCISION OF PILONODAL DISEASE    Surgeon(s): Leighton Ruff, MD  ASSISTANT: none   ANESTHESIA:   local and MAC  EBL: 31m Total I/O In: 500 [I.V.:500] Out: -   DRAINS: Penrose drain in the wound bed    SPECIMEN:  Source of Specimen:  pilonidal fistula  DISPOSITION OF SPECIMEN:  PATHOLOGY  COUNTS:  YES  PLAN OF CARE: Discharge to home after PACU  PATIENT DISPOSITION:  PACU - hemodynamically stable.  INDICATION: 41y.o. M with chronic pilonidal fistula   OR FINDINGS: pilonidal fistula  DESCRIPTION: the patient was identified in the preoperative holding area and taken to the OR where they were laid prone on the operating room table.  MAC anesthesia was induced without difficulty. SCDs were also noted to be in place prior to the initiation of anesthesia.  The patient was then prepped and draped in the usual sterile fashion.   A surgical timeout was performed indicating the correct patient, procedure, positioning and need for preoperative antibiotics.   I began by making an incision lateral to the pilonidal disease on the patient's right gluteus using a 10 blade scalpel.  This was carried down through the subcutaneous tissues to the level of the presacral fascia.  I then incised and dissected towards midline to allow for lift of the cleft I continued this dissection out into the left buttock.  I encountered a small area of scar in the subcutaneous tissue on the right side, which was excised.  I continued down to the level of the fistula.  I divided this using sharp dissection until the remaining tract was removed.  This was sent to pathology for further examination.  I then closed the midline tract wound using interrupted 2-0  Vicryl sutures underneath and then interrupted 3 oh dermal sutures on top.  I then reapproximated his subcutaneous tissue over 1/4 inch Penrose drain using interrupted 2-0 Vicryl sutures.  Penrose was brought out through the bottom of the wound and secured in place with a 2-0 nylon suture.  I then closed the dermal layer of the lateral incision using interrupted 2-0 Vicryl sutures.  The skin was closed using 2-0 nylon mattress sutures x4.  A sterile dressing was then applied.  The patient was awakened from anesthesia and sent to the postanesthesia care unit in stable condition.  All counts were correct per operating room staff.

## 2021-06-28 NOTE — Anesthesia Procedure Notes (Signed)
Procedure Name: MAC Date/Time: 06/28/2021 10:26 AM Performed by: Justice Rocher, CRNA Pre-anesthesia Checklist: Timeout performed, Patient being monitored, Suction available, Emergency Drugs available and Patient identified Patient Re-evaluated:Patient Re-evaluated prior to induction Oxygen Delivery Method: Simple face mask Preoxygenation: Pre-oxygenation with 100% oxygen Induction Type: IV induction Placement Confirmation: breath sounds checked- equal and bilateral, CO2 detector and positive ETCO2 (wheezing noted w/ SR)

## 2021-06-29 ENCOUNTER — Encounter (HOSPITAL_BASED_OUTPATIENT_CLINIC_OR_DEPARTMENT_OTHER): Payer: Self-pay | Admitting: General Surgery

## 2021-06-29 LAB — SURGICAL PATHOLOGY
